# Patient Record
Sex: Female | Born: 1950 | Race: White | Hispanic: No
Health system: Midwestern US, Community
[De-identification: ages and names within clinical notes are randomized; demographics above are authoritative.]

---

## 2018-05-23 LAB — BASIC METABOLIC PANEL
Anion Gap: 12 mmol/L (ref 2–17)
BUN: 15 mg/dL (ref 8–23)
CO2: 26 mmol/L (ref 22–29)
Calcium: 8.8 mg/dL (ref 8.8–10.2)
Chloride: 98 mmol/L (ref 98–107)
Creatinine: 0.9 mg/dL (ref 0.5–0.9)
GFR African American: 77 mL/min/{1.73_m2} — ABNORMAL LOW (ref >=90)
GFR Non-African American: 67 mL/min/{1.73_m2} — ABNORMAL LOW (ref >=90)
Glucose: 115 mg/dL — ABNORMAL HIGH (ref 70–99)
OSMOLALITY CALCULATED: 274 mOsm/kg (ref 270–287)
Potassium: 3.7 mmol/L (ref 3.5–5.3)
Sodium: 136 mmol/L (ref 135–145)

## 2018-05-23 LAB — CBC
Hematocrit: 38.8 % (ref 38.0–47.0)
Hemoglobin: 13.1 g/dL (ref 11.5–15.7)
MCH: 31.3 pg (ref 27.0–34.5)
MCHC: 33.8 g/dL (ref 32.0–36.0)
MCV: 92.6 fL (ref 81.0–99.0)
MPV: 9.1 fL (ref 7.2–13.2)
Platelets: 280 10*3/uL (ref 140–440)
RBC: 4.19 x10e6/mcL (ref 3.60–5.20)
RDW: 13.4 % (ref 11.0–16.0)
WBC: 10.3 10*3/uL (ref 3.8–10.6)

## 2018-05-23 LAB — HEPATIC FUNCTION PANEL
ALT: 13 U/L (ref 0–33)
AST: 19 U/L (ref 0–32)
Albumin: 4 g/dL (ref 3.5–5.2)
Alk Phosphatase: 77 U/L (ref 35–117)
Bilirubin, Direct: <0.2 mg/dL (ref 0.00–0.30)
Total Bilirubin: <0.15 mg/dL (ref 0.00–1.20)
Total Protein: 6.6 g/dL (ref 6.4–8.3)

## 2018-05-23 LAB — LIPASE: Lipase: 63 U/L — ABNORMAL HIGH (ref 13–60)

## 2018-05-23 NOTE — Nursing Note (Signed)
Medication Administration Follow Up-Text       Medication Administration Follow Up Entered On:  05/23/2018 4:08 EDT    Performed On:  05/23/2018 4:08 EDT by Pernell Dupre, RN, Palmo T      Intervention Information:     ondansetron  Performed by Karolee Ohs, RN, NANCY A on 05/23/2018 03:48:00 EDT       ondansetron,4mg   IV Push,Antecubital, Left       Med Response   ED Medication Response :   No adverse reaction   Numeric Rating Pain Scale :   2   Pasero Opioid Induced Sedation Scale :   1 = Awake and alert   Adams, RN, Palmo T - 05/23/2018 4:08 EDT

## 2019-03-27 LAB — CBC WITH AUTO DIFFERENTIAL
Absolute Baso #: 0 10*3/uL (ref 0.0–0.2)
Absolute Eos #: 0.5 10*3/uL (ref 0.0–0.5)
Absolute Lymph #: 2.5 10*3/uL (ref 1.0–3.2)
Absolute Mono #: 0.7 10*3/uL (ref 0.3–1.0)
Basophils %: 0.3 % (ref 0.0–2.0)
Eosinophils %: 5.8 % (ref 0.0–7.0)
Hematocrit: 36.7 % (ref 34.0–47.0)
Hemoglobin: 12.2 g/dL (ref 11.5–15.7)
Immature Grans (Abs): 0.01 10*3/uL (ref 0.00–0.06)
Immature Granulocytes: 0.1 % (ref 0.1–0.6)
Lymphocytes: 28.9 % (ref 15.0–45.0)
MCH: 31.8 pg (ref 27.0–34.5)
MCHC: 33.2 g/dL (ref 32.0–36.0)
MCV: 95.6 fL (ref 81.0–99.0)
MPV: 9 fL (ref 7.2–13.2)
Monocytes: 7.7 % (ref 4.0–12.0)
Neutrophils %: 57.2 % (ref 42.0–74.0)
Neutrophils Absolute: 4.9 10*3/uL (ref 1.6–7.3)
Platelets: 286 10*3/uL (ref 140–440)
RBC: 3.84 x10e6/mcL (ref 3.60–5.20)
RDW: 13.1 % (ref 11.0–16.0)
WBC: 8.7 10*3/uL (ref 3.8–10.6)

## 2019-03-27 LAB — BASIC METABOLIC PANEL
Anion Gap: 11 mmol/L (ref 2–17)
BUN: 10 mg/dL (ref 8–23)
CO2: 27 mmol/L (ref 22–29)
Calcium: 9.2 mg/dL (ref 8.8–10.2)
Chloride: 101 mmol/L (ref 98–107)
Creatinine: 1 mg/dL — ABNORMAL HIGH (ref 0.5–0.9)
GFR African American: 68 mL/min/{1.73_m2} — ABNORMAL LOW (ref >=90)
GFR Non-African American: 58 mL/min/{1.73_m2} — ABNORMAL LOW (ref >=90)
Glucose: 120 mg/dL — ABNORMAL HIGH (ref 70–99)
OSMOLALITY CALCULATED: 276 mOsm/kg (ref 270–287)
Potassium: 3.3 mmol/L — ABNORMAL LOW (ref 3.5–5.3)
Sodium: 138 mmol/L (ref 135–145)

## 2019-03-27 LAB — ADD ON LAB TEST

## 2019-03-27 LAB — HEPATIC FUNCTION PANEL
ALT: 13 U/L (ref 0–33)
AST: 28 U/L (ref 0–32)
Albumin: 4.1 g/dL (ref 3.5–5.2)
Alk Phosphatase: 81 U/L (ref 35–117)
Bilirubin, Direct: <0.2 mg/dL (ref 0.00–0.30)
Total Bilirubin: 0.16 mg/dL (ref 0.00–1.20)
Total Protein: 6.8 g/dL (ref 6.4–8.3)

## 2019-03-27 LAB — CK: Total CK: 272 U/L — ABNORMAL HIGH (ref 20–180)

## 2019-03-27 LAB — TROPONIN T
Troponin T: <0.01 ng/mL (ref 0.000–0.010)
Troponin T: <0.01 ng/mL (ref 0.000–0.010)

## 2019-03-27 LAB — LIPASE: Lipase: 57 U/L (ref 13–60)

## 2019-03-27 LAB — N TERMINAL PROBNP (AKA NTPROBNP): NT Pro-BNP: 130 pg/mL — ABNORMAL HIGH (ref 0–125)

## 2019-03-27 NOTE — ED Notes (Signed)
ED Triage Note       ED Secondary Triage Entered On:  03/27/2019 4:36 EDT    Performed On:  03/27/2019 4:35 EDT by Andree Elk, RN, Palmo T               General Information   Barriers to Learning :   None evident   ED Home Meds Section :   Document assessment   [facility] ED Fall Risk Section :   Document assessment   ED History Section :   Document assessment   ED Advance Directives Section :   Document assessment   ED Palliative Screen :   Document assessment   Andree Elk, RN, Palmo T - 03/27/2019 4:35 EDT   (As Of: 03/27/2019 04:36:16 EDT)   Problems(Active)    Back pain (SNOMED CT  :606301601 )  Name of Problem:   Back pain ; Recorder:   GREGOR, RN, NANCY A; Confirmation:   Confirmed ; Classification:   Patient Stated ; Code:   [id number] ; Contributor System:   Conservation officer, nature ; Last Updated:   05/23/2018 3:27 EDT ; Life Cycle Date:   05/23/2018 ; Life Cycle Status:   Active ; Vocabulary:   SNOMED CT        CP (chronic pancreatitis) (SNOMED CT  :220254270 )  Name of Problem:   CP (chronic pancreatitis) ; Recorder:   Andree Elk, RN, Palmo T; Confirmation:   Confirmed ; Classification:   Patient Stated ; Code:   [id number] ; Contributor System:   Conservation officer, nature ; Last Updated:   05/23/2018 4:00 EDT ; Life Cycle Date:   05/23/2018 ; Life Cycle Status:   Active ; Vocabulary:   SNOMED CT        Hypercholesteremia (SNOMED CT  :51761607 )  Name of Problem:   Hypercholesteremia ; Recorder:   GREGOR, RN, NANCY A; Confirmation:   Confirmed ; Classification:   Patient Stated ; Code:   [id number] ; Contributor System:   Conservation officer, nature ; Last Updated:   05/23/2018 3:27 EDT ; Life Cycle Date:   05/23/2018 ; Life Cycle Status:   Active ; Vocabulary:   SNOMED CT        Neuropathy (SNOMED CT  :4854627035 )  Name of Problem:   Neuropathy ; Recorder:   GREGOR, RN, NANCY A; Confirmation:   Confirmed ; Classification:   Patient Stated ; Code:   [id number] ; Contributor System:   Conservation officer, nature ; Last Updated:   05/23/2018 3:28 EDT ; Life Cycle Date:   05/23/2018 ; Life  Cycle Status:   Active ; Vocabulary:   SNOMED CT        Sciatic pain (SNOMED CT  :37169678 )  Name of Problem:   Sciatic pain ; Recorder:   GREGOR, RN, NANCY A; Confirmation:   Confirmed ; Classification:   Patient Stated ; Code:   [id number] ; Contributor System:   PowerChart ; Last Updated:   05/23/2018 3:27 EDT ; Life Cycle Date:   05/23/2018 ; Life Cycle Status:   Active ; Vocabulary:   SNOMED CT          Diagnoses(Active)    Chest pain  Date:   03/27/2019 ; Diagnosis Type:   Reason For Visit ; Confirmation:   Complaint of ; Clinical Dx:   Chest pain ; Classification:   Medical ; Clinical Service:   Emergency medicine ; Code:   PNED ; Probability:   0 ; Diagnosis Code:   [id number]             -  Procedure History   (As Of: 03/27/2019 04:36:16 EDT)     Anesthesia Minutes:   0 ; Procedure Name:   Cesarean section ; Procedure Minutes:   0 ; Last Reviewed Dt/Tm:   05/23/2018 03:58:36 EDT            Anesthesia Minutes:   0 ; Procedure Name:   Cholecystectomy ; Procedure Minutes:   0 ; Last Reviewed Dt/Tm:   05/23/2018 03:59:19 EDT            Anesthesia Minutes:   0 ; Procedure Name:   Appendectomy ; Procedure Minutes:   0 ; Last Reviewed Dt/Tm:   05/23/2018 03:59:48 EDT            Anesthesia Minutes:   0 ; Procedure Name:   Hysterectomy ; Procedure Minutes:   0 ; Last Reviewed Dt/Tm:   05/23/2018 03:59:35 EDT            Anesthesia Minutes:   0 ; Procedure Name:   Gastric sleeve surgery ; Procedure Minutes:   0 ; Last Reviewed Dt/Tm:   05/23/2018 04:08:27 EDT            [facility] Fall Risk Assessment Tool   Hx of falling last 3 months ED Fall :   No   Patient confused or disoriented ED Fall :   No   Patient intoxicated or sedated ED Fall :   No   Patient impaired gait ED Fall :   No   Use a mobility assistance device ED Fall :   No   Patient altered elimination ED Fall :   No   [facility] ED Fall Score :   0    Adams, RN, Palmo T - 03/27/2019 4:35 EDT   ED Advance Directive   Advance Directive :    No   Adams, RN, Palmo T - 03/27/2019 4:35 EDT   Palliative Care   Does the Patient have a Life Limiting Illness :   None of the above   Pernell DupreAdams, RN, Palmo T - 03/27/2019 4:35 EDT   Social History   Social History   (As Of: 03/27/2019 04:36:16 EDT)   Tobacco:        Tobacco use: 10 or more cigarettes (1/2 pack or more)/day in last 30 days.  Cigarettes   (Last Updated: 05/23/2018 03:58:00 EDT by Pernell DupreAdams, RN, Palmo T)

## 2019-03-27 NOTE — ED Notes (Signed)
 ED Patient Summary       ;          [facility]  [address], [place], [place] [address]  [phone]  Discharge Instructions (Patient)  _______________________________________     Name:[name], [name]  DOB:  1950-08-21                   MRN: [id number]                   FIN: [id number]  Reason For Visit: Chest pain; CHEST PAIN  Final Diagnosis: Gastritis     Visit Date: 03/27/2019 04:22:00  Address: [address] [place] [address]  Phone: [phone]     Primary Care Provider:      Name: STEPHEN CAMIE JACOBUS      Phone: [phone]        Emergency Department Providers:         Primary Physician:   LANE JINNY PRENTICE [facility] would like to thank you for allowing us  to assist you with your healthcare needs. The following includes patient education materials and information regarding your injury/illness.     Follow-up Instructions:  You were treated today on an emergency basis. If instructed, please contact your primary care provider to arrange for follow-up and for any further concerns. Whether you have been referred to your primary care doctor or a specialist, please follow-up as instructed.      If you do not have a doctor, you may call [phone] for assistance with finding a [facility] primary care physician or specialist. Staff is available to help schedule you an appointment.      Not sure where to go with questions about your health? We're here for you. The [facility] "Ask a Nurse" Line in staffed by experienced nurses and is a free service to the community, available Monday - Friday from 8AM to 5PM. Call [phone].      If your condition worsens before your follow-up with an outpatient physician, please return to the Emergency Department.              With: Address: When:   [facility] [address] [place], [place] [address]  [phone] Business (1) Within 1 week       With: Address:  When:   [facility] [address] [place] [address]  [phone] Business (1) Within 1 to 2 days   Comments:   Please take your medications as prescribed.  Please return to the emergency department with any worsening of your symptoms, difficulty tolerating oral intake, or any other new or concerning symptoms.       With: Address: When:   [facility], [address], [address] [place], [place] [address]  [phone] Business (1) Within 2 to 4 days   Comments:   Return to ED if symptoms worsen              Printed Prescriptions:    Patient Education Materials:  Discharge Orders          Discharge Patient 03/27/19 7:06:00 EDT         Comment:      Gastritis, Adult     Gastritis, Adult    Gastritis is soreness and swelling (inflammation) of the lining of the stomach. Gastritis can develop as  a sudden onset (acute) or long-term (chronic) condition. If gastritis is not treated, it can lead to stomach bleeding and ulcers.      CAUSES    Gastritis occurs when the stomach lining is weak or damaged. Digestive juices from the stomach then inflame the weakened stomach lining. The stomach lining may be weak or damaged due to viral or bacterial infections. One common bacterial infection is the Helicobacter pylori  infection. Gastritis can also result from excessive alcohol consumption, taking certain medicines, or having too much acid in the stomach.     SYMPTOMS    In some cases, there are no symptoms. When symptoms are present, they may include:     Pain or a burning sensation in the upper abdomen.     Nausea.     Vomiting.     An uncomfortable feeling of fullness after eating.     DIAGNOSIS    Your caregiver may suspect you have gastritis based on your symptoms and a physical exam. To determine the cause of your gastritis, your caregiver may perform the following:     Blood or stool tests to check for the H pylori bacterium.     Gastroscopy. A thin,  flexible tube (endoscope) is passed down the esophagus and into the stomach. The endoscope has a light and camera on the end. Your caregiver uses the endoscope to view the inside of the stomach.     Taking a tissue sample (biopsy) from the stomach to examine under a microscope.     TREATMENT    Depending on the cause of your gastritis, medicines may be prescribed. If you have a bacterial infection, such as an H pylori infection, antibiotics may be given. If your gastritis is caused by too much acid in the stomach, H2 blockers or antacids may be given. Your caregiver may recommend that you stop taking aspirin, ibuprofen, or other nonsteroidal anti-inflammatory drugs (NSAIDs).    HOME CARE INSTRUCTIONS     Only take over-the-counter or prescription medicines as directed by your caregiver.     If you were given antibiotic medicines, take them as directed. Finish them even if you start to feel better.     Drink enough fluids to keep your urine clear or pale yellow.      Avoid foods and drinks that make your symptoms worse, such as:    ? Caffeine or alcoholic drinks.    ? Chocolate.    ? Peppermint or mint flavorings.    ? Garlic and onions.    ? Spicy foods.    ? Citrus fruits, such as oranges, lemons, or limes.    ? Tomato-based foods such as sauce, chili, salsa, and pizza.    ? Fried and fatty foods.     Eat small, frequent meals instead of large meals.    SEEK IMMEDIATE MEDICAL CARE IF:     You have black or dark red stools.     You vomit blood or material that looks like coffee grounds.     You are unable to keep fluids down.     Your abdominal pain gets worse.     You have a fever.     You do not feel better after 1 week.      You have any other questions or concerns.    MAKE SURE YOU:     Understand these instructions.     Will watch your condition.     Will get help right away if  you are not doing well or get worse.    This information is not intended to replace advice given to you by your health care provider.  Make sure you discuss any questions you have with your health care provider.    Document Released: 07/14/2001 Document Revised: 01/19/2012 Document Reviewed: 09/02/2011  Elsevier Interactive Patient Education ?2016 [employer].         Allergy Info: sulfa drugs; Demerol     Medication Information:  [facility] ED Physicians provided you with a complete list of medications post discharge, if you have been instructed to stop taking a medication please ensure you also follow up with this information to your Primary Care Physician.  Unless otherwise noted, patient will continue to take medications as prescribed prior to the Emergency Room visit.  Any specific questions regarding your chronic medications and dosages should be discussed with your physician(s) and pharmacist.          ezetimibe (ezetimibe 10 mg oral tablet)  famotidine (Pepcid 20 mg oral tablet) 1 Tabs Oral (given by mouth) 2 times a day for 7 Days. Refills: 0.  FLUoxetine (FLUoxetine 20 mg oral tablet) 1 Tabs Oral (given by mouth) every day., DO NOT CRUSH     SOUND ALIKE / LOOK ALIKE - VERIFY DRUG  fluticasone-salmeterol (Advair Diskus 100 mcg-50 mcg inhalation powder) 2 times a day.  HYDROcodone-acetaminophen (Norco 325 mg-7.5 mg oral tablet) 1 Tabs Oral (given by mouth) 3 times a day.  hydrOXYzine (hydrOXYzine hydrochloride 25 mg oral tablet)  multivitamin (Vitamin B Complex oral capsule) 1 Capsules Oral (given by mouth) every day.  multivitamin, prenatal (Prenatal Multivitamins oral tablet)  oxyCODONE (Xtampza ER 13.5 mg oral capsule, extended release) 1 Capsules Oral (given by mouth) every 12 hours.  pregabalin (pregabalin 200 mg oral capsule)  rosuvastatin (rosuvastatin 40 mg oral tablet) 1 Tabs Oral (given by mouth) every day.  tiZANidine 4 Milligram Oral (given by mouth) every 8 hours.      Medications Administered During Visit:              Medication Dose Route   Sodium Chloride 0.9% 1000 mL IV   lidocaine topical 15  mL Oral   Al hydroxide/Mg hydroxide/simethicone 30 mL Oral   famotidine 20 mg IV Push          Major Tests and Procedures:  The following procedures and tests were performed during your ED visit.  PROCEDURES%>  PROCEDURES COMMENTS%>          Laboratory Orders  Name Status Details   Add On Completed Blood, Stat, ST - Stat, Collected, 03/27/19 6:01:00 EDT, Once, 03/27/19 6:01:00 EDT, HURT-MD,  DAVID C, Print label Y/N, lipase,, Draw Stat   Add On Completed Blood, Stat, ST - Stat, Collected, 03/27/19 6:02:00 EDT, Once, 03/27/19 6:02:00 EDT, HURT-MD,  DAVID C, Print label Y/N, hepatic panel, Draw Stat   BMP Completed Blood, Stat, ST - Stat, 03/27/19 4:35:00 EDT, 03/27/19 4:36:00 EDT, Nurse collect, LANE JINNY BARTER, Print label Y/N   CBCDIFF Completed Blood, Stat, ST - Stat, 03/27/19 4:35:00 EDT, 03/27/19 4:36:00 EDT, Nurse collect, LANE JINNY BARTER, Print label Y/N   CPK Completed Blood, Stat, ST - Stat, 03/27/19 4:35:00 EDT, 03/27/19 4:36:00 EDT, Nurse collect, LANE JINNY BARTER, Print label Y/N   Hepatic Completed Blood, Stat, ST - Stat, Collected, 03/27/19 4:36:00 EDT [id number], 03/27/19 4:36:00 EDT, Nurse collect, Venous Draw, 03/27/19 6:04:00 EDT, BH CP Login, POTTER,  JINNY BARTER, Print  label Y/N, [email or web address], 4 mL SST PST/*A*/   Lipase Lvl Completed Blood, ST, ST - Stat, Collected, 03/27/19 4:36:00 EDT Myra, RN, Palmo T, 03/27/19 4:36:00 EDT, Venous Draw, 03/27/19 4:40:00 EDT, BH CP Login, [id number], Print label Y/N, [email or web address], 4 mL SST PST/*B*/   NTproBNP Completed Blood, Stat, ST - Stat, 03/27/19 4:35:00 EDT, 03/27/19 4:36:00 EDT, Nurse collect, LANE JINNY BARTER, Print label Y/N   Trop T Completed Blood, Routine, RT - Routine, 03/27/19 6:35:00 EDT, 03/27/19 6:35:00 EDT, Nurse collect, LANE JINNY BARTER, Print label Y/N   Trop T Completed Blood, Stat, ST - Stat, 03/27/19 4:35:00 EDT, 03/27/19 4:36:00 EDT, Nurse collect, LANE JINNY BARTER, Print label Y/N   Irwin Carbon Completed Blood, ST, ST - Stat,  Collected, 03/27/19 4:36:00 EDT [id number], 03/27/19 4:36:00 EDT, Venous Draw, 03/27/19 4:39:00 EDT, BH CP Login, [id number], Print label Y/N, [email or web address], Complete   XTube Grn Completed Blood, ST, ST - Stat, Collected, 03/27/19 4:36:00 EDT [id number], 03/27/19 4:36:00 EDT, Venous Draw, 03/27/19 4:40:00 EDT, BH CP Login, [id number], Print label Y/N, [email or web address], Complete   XTube PST Completed Blood, ST, ST - Stat, Collected, 03/27/19 4:36:00 EDT [id number], 03/27/19 4:36:00 EDT, Venous Draw, 03/27/19 4:40:00 EDT, BH CP Login, [id number], Print label Y/N, [email or web address], Complete               Radiology Orders  Name Status Details   XR Chest 1 View Portable Ordered 03/27/19 4:35:00 EDT, STAT 1 hour or less, Reason: Chest pain, Transport Mode: PORTABLE, [email or web address]               Patient Care Orders  Name Status Details   Blood Pressure Completed 03/27/19 4:35:00 EDT, Once, 03/27/19 4:35:00 EDT, Check BP both arms   Cardiac/NIBP/Pulse Ox Monitoring Completed 03/27/19 4:35:00 EDT, This message can only be seen by Nursing, it is not visible to Pharmacy, Laboratory, or Radiology., 03/27/19 4:35:00 EDT, 03/27/19 4:35:00 EDT, Once   Discharge Patient Ordered 03/27/19 7:06:00 EDT   ED Assessment Adult Completed 03/27/19 4:35:27 EDT, 03/27/19 4:35:27 EDT   ED Only Oxygen Therapy Completed 03/27/19 4:35:00 EDT, STAT 1 hour or less, 03/27/19 4:35:00 EDT, Keep SAT > 92%   ED Secondary Triage Completed 03/27/19 4:35:27 EDT, 03/27/19 4:35:27 EDT   ED Triage Adult Completed 03/27/19 4:22:28 EDT, 03/27/19 4:22:28 EDT   Saline Lock Insert Completed 03/27/19 4:35:00 EDT, Once, 03/27/19 4:35:00 EDT       ---------------------------------------------------------------------------------------------------------------------  [facility]) encourages you to self-enroll in the [facility] Patient Portal.  [facility] Patient Portal will allow you to manage your personal health information securely from  your own electronic device now and in the future.  To begin your Patient Portal enrollment process, please visit [email or web address] Click on "Sign up now" under [facility].  If you find that you need additional assistance on the [facility] Patient Portal or need a copy of your medical records, please call the [facility] Medical Records Office at [phone].  Comment:

## 2019-03-27 NOTE — Discharge Summary (Signed)
ED Clinical Summary                         [facility]  [address]  [place], [place] [id number]  [phone]           PERSON INFORMATION  Name: [name] [name], [name] [name] Age:  68 Years DOB: 02/09/1951   Sex: Female Language: English PCP: Kandace BlitzMONTOYA-MD,  SARA BLAKELY   Marital Status:  Married Phone: [phone] Med Service: MED-Medicine   MRN:  [id number] Acct# [id number] Arrival: 03/27/2019 04:22:00   Visit Reason: Chest pain; CHEST PAIN Acuity: 3 LOS: 000 02:56   Address:      [address] [place] [id number]  Diagnosis:      Gastritis  Printed Prescriptions:            Allergies      sulfa drugs (unknwon) (burn and itch)      Demerol (makes me stop breathing)      Medications Administered During Visit:                  Medication Dose Route   Sodium Chloride 0.9% 1000 mL IV   lidocaine topical 15 mL Oral   Al hydroxide/Mg hydroxide/simethicone 30 mL Oral   famotidine 20 mg IV Push       Patient Medication List:              ezetimibe (ezetimibe 10 mg oral tablet)  famotidine (Pepcid 20 mg oral tablet) 1 Tabs Oral (given by mouth) 2 times a day for 7 Days. Refills: 0.  FLUoxetine (FLUoxetine 20 mg oral tablet) 1 Tabs Oral (given by mouth) every day., DO NOT CRUSH     SOUND ALIKE / LOOK ALIKE - VERIFY DRUG  fluticasone-salmeterol (Advair Diskus 100 mcg-50 mcg inhalation powder) 2 times a day.  HYDROcodone-acetaminophen (Norco 325 mg-7.5 mg oral tablet) 1 Tabs Oral (given by mouth) 3 times a day.  hydrOXYzine (hydrOXYzine hydrochloride 25 mg oral tablet)  multivitamin (Vitamin B Complex oral capsule) 1 Capsules Oral (given by mouth) every day.  multivitamin, prenatal (Prenatal Multivitamins oral tablet)  oxyCODONE (Xtampza ER 13.5 mg oral capsule, extended release) 1 Capsules Oral (given by mouth) every 12 hours.  pregabalin (pregabalin 200 mg oral capsule)  rosuvastatin (rosuvastatin 40 mg oral tablet) 1 Tabs Oral (given by mouth) every day.  tiZANidine 4 Milligram Oral (given by  mouth) every 8 hours.         Major Tests and Procedures:  The following procedures and tests were performed during your ED visit.  COMMONPROCEDURES%>  COMMON PROCEDURESCOMMENTS%>          Laboratory Orders  Name Status Details   Add On Completed Blood, Stat, ST - Stat, Collected, 03/27/19 6:01:00 EDT, Once, 03/27/19 6:01:00 EDT, HURT-MD,  DAVID C, Print label Y/N, lipase,, Draw Stat   Add On Completed Blood, Stat, ST - Stat, Collected, 03/27/19 6:02:00 EDT, Once, 03/27/19 6:02:00 EDT, HURT-MD,  DAVID C, Print label Y/N, hepatic panel, Draw Stat   BMP Completed Blood, Stat, ST - Stat, 03/27/19 4:35:00 EDT, 03/27/19 4:36:00 EDT, Nurse collect, [name] RuddlePOTTER,  J ANDREW, Print label Y/N   CBCDIFF Completed Blood, Stat, ST - Stat, 03/27/19 4:35:00 EDT, 03/27/19 4:36:00 EDT, Nurse collect, [name] RuddlePOTTER,  J ANDREW, Print label Y/N   CPK Completed Blood, Stat, ST - Stat, 03/27/19 4:35:00 EDT, 03/27/19 4:36:00 EDT, Nurse collect, [name] RuddlePOTTER,  J ANDREW, Print label Y/N   Hepatic  Completed Blood, Stat, ST - Stat, Collected, 03/27/19 4:36:00 EDT [id number], 03/27/19 4:36:00 EDT, Nurse collect, Venous Draw, 03/27/19 6:04:00 EDT, BH CP Login, POTTER,  J ANDREW, Print label Y/N, bh1_spec_lbl1, 4 mL SST PST/*A*/   Lipase Lvl Completed Blood, ST, ST - Stat, Collected, 03/27/19 4:36:00 EDT Pernell DupreAdams, RN, Palmo T, 03/27/19 4:36:00 EDT, Venous Draw, 03/27/19 4:40:00 EDT, BH CP Login, [id number], Print label Y/N, bh1_spec_lbl1, 4 mL SST PST/*B*/   NTproBNP Completed Blood, Stat, ST - Stat, 03/27/19 4:35:00 EDT, 03/27/19 4:36:00 EDT, Nurse collect, [name] RuddlePOTTER,  J ANDREW, Print label Y/N   Trop T Completed Blood, Routine, RT - Routine, 03/27/19 6:35:00 EDT, 03/27/19 6:35:00 EDT, Nurse collect, [name] RuddlePOTTER,  J ANDREW, Print label Y/N   Trop T Completed Blood, Stat, ST - Stat, 03/27/19 4:35:00 EDT, 03/27/19 4:36:00 EDT, Nurse collect, [name] RuddlePOTTER,  J ANDREW, Print label Y/N   Joselyn GlassmanXTube Blue Completed Blood, ST, ST - Stat, Collected, 03/27/19 4:36:00 EDT [id number], 03/27/19 4:36:00 EDT,  Venous Draw, 03/27/19 4:39:00 EDT, BH CP Login, [id number], Print label Y/N, [email or web address]_spec_lbl1, Complete   XTube Grn Completed Blood, ST, ST - Stat, Collected, 03/27/19 4:36:00 EDT [id number], 03/27/19 4:36:00 EDT, Venous Draw, 03/27/19 4:40:00 EDT, BH CP Login, [id number], Print label Y/N, [email or web address]_[email or web address], Complete   XTube PST Completed Blood, ST, ST - Stat, Collected, 03/27/19 4:36:00 EDT [id number], 03/27/19 4:36:00 EDT, Venous Draw, 03/27/19 4:40:00 EDT, BH CP Login, [id number], Print label Y/N, [email or web address]_spec_lbl1, Complete               Radiology Orders  Name Status Details   XR Chest 1 View Portable Ordered 03/27/19 4:35:00 EDT, STAT 1 hour or less, Reason: Chest pain, Transport Mode: PORTABLE, pp_set_radiology_subspecialty               Patient Care Orders  Name Status Details   Blood Pressure Completed 03/27/19 4:35:00 EDT, Once, 03/27/19 4:35:00 EDT, Check BP both arms   Cardiac/NIBP/Pulse Ox Monitoring Completed 03/27/19 4:35:00 EDT, This message can only be seen by Nursing, it is not visible to Pharmacy, Laboratory, or Radiology., 03/27/19 4:35:00 EDT, 03/27/19 4:35:00 EDT, Once   Discharge Patient Ordered 03/27/19 7:06:00 EDT   ED Assessment Adult Completed 03/27/19 4:35:27 EDT, 03/27/19 4:35:27 EDT   ED Only Oxygen Therapy Completed 03/27/19 4:35:00 EDT, STAT 1 hour or less, 03/27/19 4:35:00 EDT, Keep SAT > 92%   ED Secondary Triage Completed 03/27/19 4:35:27 EDT, 03/27/19 4:35:27 EDT   ED Triage Adult Completed 03/27/19 4:22:28 EDT, 03/27/19 4:22:28 EDT   Saline Lock Insert Completed 03/27/19 4:35:00 EDT, Once, 03/27/19 4:35:00 EDT             PROVIDER INFORMATION               Provider Role Assigned Lavonne ChickUnassigned   POTTER, J ANDREW ED Provider 03/27/2019 04:22:55    Pernell DupreAdams, RN, Palmo T ED Nurse 03/27/2019 04:53:56    Karolee OhsGREGOR, RNErma Heritage, NANCY A ED Nurse 03/27/2019 [phone]:33:22        Attending Physician:  [name] RuddlePOTTER,  J ANDREW     Admit Doc  POTTER,  Osborne OmanJ ANDREW     Consulting Doc       VITALS INFORMATION  Vital Sign Triage Latest   Temp Oral  ORAL_1%>37 degC ORAL%>37 degC   Temp Temporal TEMPORAL_1%> TEMPORAL%>   Temp Intravascular INTRAVASCULAR_1%> INTRAVASCULAR%>   Temp Axillary AXILLARY_1%> AXILLARY%>   Temp Rectal RECTAL_1%> RECTAL%>   02 Sat 97 % 99 %   Respiratory Rate RATE_1%>18 br/min RATE%>22 br/min   Peripheral Pulse Rate PULSE RATE_1%>78  bpm PULSE RATE%>78 bpm   Apical Heart Rate HEART RATE_1%> HEART RATE%>   Blood Pressure BLOOD PRESSURE_1%>/ BLOOD PRESSURE_1%>89 mmHg BLOOD PRESSURE%>119 mmHg / BLOOD PRESSURE%>66 mmHg                 Immunizations      No Immunizations Documented This Visit          DISCHARGE INFORMATION   Discharge Disposition: H Outpt-Sent Home   Discharge Location:    Home   Discharge Date and Time:    03/27/2019 07:18:01   ED Checkout Date and Time:    03/27/2019 07:18:01     DEPART REASON INCOMPLETE INFORMATION               Depart Action Incomplete Reason   Interactive View/I&O Recently assessed               Problems      No Problems Documented              Smoking Status      10 or more cigarettes (1/2 pack or more)/day in last 30 days         PATIENT EDUCATION INFORMATION  Instructions:       Gastritis, Adult     Follow up:                    With: Address: When:   [facility] [address] [place] [place] [address]  [phone] Business (1) Within 1 week       With: Address: When:   SARA MONTOYA-MD [address] [place] [address]  [phone] Business (1) Within 1 to 2 days   Comments:   Please take your medications as prescribed.  Please return to the emergency department with any worsening of your symptoms, difficulty tolerating oral intake, or any other new or concerning symptoms.       With: Address: When:   [facility], [address], [address] [place], [place] [address]  [phone] Business (1) Within 2 to 4 days   Comments:   Return to ED if symptoms worsen           ED PROVIDER DOCUMENTATION     Patient:   [name] [name], [name] [name]              MRN: [id number]            FIN: [id number]               Age:   68 years     Sex:  Female     DOB:  June 02, 1951   Associated Diagnoses:   Gastritis   Author:   [place] [name]      Basic Information   Time seen: Provider Seen (ST)   ED Provider/Time:    [name] [name] / 03/27/2019 04:22  .   Additional information: Chief Complaint from Nursing Triage Note   Chief Complaint   No qualifying data available.Marland Kitchen      History of Present Illness   68 year old female with a history of hypertension COPD, Esophagitis who had an episode of epigastric burning pain yesterday evening she was going to bed because her vomit once this was nonbloody nonbilious-like vomit.  She then went to bed and had a epigastric burning sensation again that woke her from sleep.  She noted improvement with elevating and getting up to walk around she had no associated  shortness of breath or dyspnea on exertion or fever chills or body aches.  She had a former history of reflux has been treated with Carafate but she was unable to tolerate this she has not been on any recent antacids and she had a large greasy fatty meal yesterday before going to bed and feels that this provoked her overall symptoms.  Otherwise she endorses no dark stool or other concern she has no other concerning risk factors from an ACS standpoint.  Took some Tums prior to arrival and felt improvement with these meds.      Review of Systems   Constitutional symptoms:  No fever, no chills, no sweats, no generalized weakness, no fatigue, no decreased activity.    Skin symptoms:  No jaundice, no rash.    Eye symptoms:  Negative except as documented in HPI.   ENMT symptoms:  Negative except as documented in HPI.   Respiratory symptoms:  Negative except as documented in HPI.   Cardiovascular symptoms:  No chest pain, no palpitations, no tachycardia, no syncope, no diaphoresis.    Gastrointestinal symptoms:  Abdominal pain, epigastric, burning, cramping, no nausea, no vomiting, no diarrhea.     Genitourinary symptoms:  Negative except as documented in HPI.   Musculoskeletal symptoms:  No back pain, no Muscle pain, no Joint pain.    Neurologic symptoms:  No headache, no dizziness, no altered level of consciousness, no numbness, no tingling, no focal weakness.    Psychiatric symptoms:  No anxiety,              Additional review of systems information: All other systems reviewed and otherwise negative.      Health Status   Allergies:    Allergic Reactions (Selected)  Unknown  Demerol- Makes me stop breathing.  Sulfa drugs- Unknwon and burn and itch..      Past Medical/ Family/ Social History   Medical history: Reviewed as documented in chart.   Surgical history: Reviewed as documented in chart.   Family history: Not significant.   Social history: Reviewed as documented in chart.   Problem list:    Active Problems (5)  Back pain   CP (chronic pancreatitis)   Hypercholesteremia   Neuropathy   Sciatic pain   .      Physical Examination               Vital Signs   Reviewed Results: Vital Signs(Date Range: 03/26/2019 0:00 EDT - 03/27/2019 5:48 EDT).   Reviewed Results: Basic Oxygen Information(Date Range: 03/26/2019 0:00 EDT - 03/27/2019 5:48 EDT).   General:  Alert, no acute distress.    Glasgow coma scale:  Total score: Total score: 15.   Neurological:  Alert and oriented to person, place, time, and situation, No focal neurological deficit observed.    Skin:  Warm, pink.    Head:  Normocephalic.   Neck:  Supple, trachea midline.    Eye:  Pupils are equal, round and reactive to light, normal conjunctiva.    Ears, nose, mouth and throat:  Oral mucosa moist.   Cardiovascular:  Regular rate and rhythm.   Respiratory:  Lungs are clear to auscultation, respirations are non-labored.    Chest wall:  No tenderness.   Back:  Nontender, Normal range of motion.    Musculoskeletal:  Normal ROM, normal strength.    Gastrointestinal:  Soft, Nontender.    Genitourinary   Psychiatric:  Cooperative.      Medical Decision Making    Electrocardiogram:  Emergency Provider interpretation performed by me, No ST-T changes, no ectopy, normal PR & QRS intervals, Reviewed Results: (Date Range: 03/26/2019 0:00 EDT - 03/27/2019 5:49 EDT).       Reexamination/ Reevaluation   Course: improving.   Pain status: decreased.   Assessment: Patient's overall symptoms dramatically improving find GI cocktail and Pepcid.  I feel as though these preceding symptoms are related to a reflux after recently eating greasy fatty meal.  She has no other concerning ACS symptoms her heart score is low her symptoms are nonexertional she had a former cardiac work-up which is been unremarkable.  I will recommend diet modifications and close follow-up with GI will place her on Pepcid until further evaluation.  If she were to have any further concerning symptoms encouraged her to return immediately.  We will repeat her troponin and EKG and if negative will be appropriate charge home for follow-up as advised..      Impression and Plan   Diagnosis   Gastritis (ICD10-CM K29.70, Discharge, Medical)   Plan   Condition: Improved, Stable.    Disposition: Discharged: to home.    Prescriptions: Launch prescriptions   Pharmacy:  Pepcid 20 mg oral tablet (Prescribe): 20 mg, 1 tabs, Oral, BID, for 7 days, 14 tabs, 0 Refill(s).    Patient was given the following educational materials: Gastritis, Adult.    Follow up with: [place] Within 2 to 4 days Return to ED if symptoms worsen.    Counseled: Patient, I had a detailed discussion with the patient and/or guardian regarding the historical points/exam findings supporting the discharge diagnosis and need for outpatient followup. Discussed the need to return to the ER if symptoms persist/worsen, or for any questions/concerns that arise at home.

## 2019-03-27 NOTE — ED Notes (Signed)
 ED Triage Note       ED Triage Adult Entered On:  03/27/2019 4:35 EDT    Performed On:  03/27/2019 4:26 EDT by Myra, RN, Palmo T               Triage   Chief Complaint :    states i woke up 20 mins ago with horrible chest pain pt also states i threw up before i went to bed   Numeric Rating Pain Scale :   7   [place] Mode of Arrival :   Private vehicle   Infectious Disease Documentation :   Document assessment   Patient received chemo or biotherapy last 48 hrs? :   No   Temperature Oral :   37 degC(Converted to: 98.6 degF)    Heart Rate Monitored :   82 bpm   Respiratory Rate :   18 br/min   Systolic Blood Pressure :   143 mmHg (HI)    Diastolic Blood Pressure :   89 mmHg   SpO2 :   97 %   Oxygen Therapy :   Room air   Patient presentation :   None of the above   Chief Complaint or Presentation suggest infection :   No   Dosing Weight Obtained By :   Measured   Weight Dosing :   99.6 kg(Converted to: 219 lb 9 oz)    Height :   164 cm(Converted to: 5 ft 5 in)    Body Mass Index Dosing :   37 kg/m2   Myra, RN, Palmo T - 03/27/2019 4:26 EDT   DCP GENERIC CODE   Tracking Acuity :   3   Tracking Group :   ED [facility] [employer], RN, [place] T - 03/27/2019 4:26 EDT   ED General Section :   Document assessment   Pregnancy Status :   N/A   ED Allergies Section :   Document assessment   ED Reason for Visit Section :   Document assessment   ED Home Meds Section :   Document assessment   Adams, RN, Palmo T - 03/27/2019 4:26 EDT   ID Risk Screen Symptoms   Recent Travel History :   No recent travel   Close Contact with COVID-19 ID :   No   Last 14 days COVID-19 ID :   No   TB Symptom Screen :   No symptoms   C. diff Symptom/History ID :   Neither of the above   Adams, RN, Palmo T - 03/27/2019 4:26 EDT   Allergies   (As Of: 03/27/2019 04:35:26 EDT)   Allergies (Active)   Demerol  Estimated Onset Date:   Unspecified ; Reactions:   makes me stop breathing ; Created By:   FARRELL, RN, NANCY A; Reaction Status:   Active ;  Category:   Drug ; Substance:   Demerol ; Type:   Allergy ; Severity:   Unknown ; Updated By:   FARRELL RN, INOCENTE LABOR; Reviewed Date:   03/27/2019 4:25 EDT      sulfa drugs  Estimated Onset Date:   Unspecified ; Reactions:   unknwon, burn and itch ; Created By:   FARRELL, RN, NANCY A; Reaction Status:   Active ; Category:   Drug ; Substance:   sulfa drugs ; Type:   Allergy ; Severity:   Unknown ; Updated By:   FARRELL OBIE INOCENTE LABOR; Reviewed Date:   03/27/2019 4:25 EDT  Psycho-Social   Last 3 mo, thoughts killing self/others :   Patient denies   Feels Unsafe at Home :   No   Adams, RN, Palmo T - 03/27/2019 4:26 EDT   ED Home Med List   Medication List   (As Of: 03/27/2019 04:35:26 EDT)   Prescription/Discharge Order    omeprazole  :   omeprazole ; Status:   Completed ; Ordered As Mnemonic:   PriLOSEC 20 mg oral delayed release capsule ; Simple Display Line:   20 mg, 1 caps, Oral, Daily, 30 caps, 0 Refill(s) ; Ordering Provider:   GENNY, MD, MAUDE MANGO; Catalog Code:   omeprazole ; Order Dt/Tm:   05/23/2018 04:47:42 EDT          sucralfate  :   sucralfate ; Status:   Completed ; Ordered As Mnemonic:   Carafate 1 g oral tablet ; Simple Display Line:   1 g, 1 tabs, Oral, TID, for 7 days, PRN: indigestion, 21 tabs, 0 Refill(s) ; Ordering Provider:   GENNY, MD, MAUDE MANGO; Catalog Code:   sucralfate ; Order Dt/Tm:   05/23/2018 04:47:54 EDT            Home Meds    fluticasone-salmeterol  :   fluticasone-salmeterol ; Status:   Documented ; Ordered As Mnemonic:   Advair Diskus 100 mcg-50 mcg inhalation powder ; Simple Display Line:   inh, BID, 0 Refill(s) ; Catalog Code:   fluticasone-salmeterol ; Order Dt/Tm:   03/27/2019 04:35:01 EDT          hydrOXYzine  :   hydrOXYzine ; Status:   Documented ; Ordered As Mnemonic:   hydrOXYzine hydrochloride 25 mg oral tablet ; Simple Display Line:   0 Refill(s) ; Catalog Code:   hydrOXYzine ; Order Dt/Tm:   03/27/2019 04:35:07 EDT          ezetimibe  :   ezetimibe ; Status:    Documented ; Ordered As Mnemonic:   ezetimibe 10 mg oral tablet ; Simple Display Line:   0 Refill(s) ; Catalog Code:   ezetimibe ; Order Dt/Tm:   03/27/2019 04:35:07 EDT          pregabalin  :   pregabalin ; Status:   Documented ; Ordered As Mnemonic:   pregabalin 200 mg oral capsule ; Simple Display Line:   0 Refill(s) ; Catalog Code:   pregabalin ; Order Dt/Tm:   03/27/2019 04:35:07 EDT          FLUoxetine  :   FLUoxetine ; Status:   Documented ; Ordered As Mnemonic:   FLUoxetine 20 mg oral tablet ; Simple Display Line:   20 mg, 1 tabs, Oral, Daily, 30 tabs, 0 Refill(s) ; Catalog Code:   FLUoxetine ; Order Dt/Tm:   05/23/2018 04:02:26 EDT ; Comment:   DO NOT CRUSH     SOUND ALIKE / LOOK ALIKE - VERIFY DRUG          tiZANidine  :   tiZANidine ; Status:   Documented ; Ordered As Mnemonic:   tiZANidine ; Simple Display Line:   4 mg, Oral, q8hr, 0 Refill(s) ; Catalog Code:   tiZANidine ; Order Dt/Tm:   05/23/2018 04:02:16 EDT          HYDROcodone-acetaminophen  :   HYDROcodone-acetaminophen ; Status:   Documented ; Ordered As Mnemonic:   Norco 325 mg-7.5 mg oral tablet ; Simple Display Line:   1 tabs, Oral, TID, 0 Refill(s) ; Catalog Code:  HYDROcodone-acetaminophen ; Order Dt/Tm:   05/23/2018 03:32:32 EDT          multivitamin  :   multivitamin ; Status:   Documented ; Ordered As Mnemonic:   Vitamin B Complex oral capsule ; Simple Display Line:   1 caps, Oral, Daily, 30 caps, 0 Refill(s) ; Catalog Code:   multivitamin ; Order Dt/Tm:   05/23/2018 03:31:05 EDT          multivitamin, prenatal  :   multivitamin, prenatal ; Status:   Documented ; Ordered As Mnemonic:   Prenatal Multivitamins oral tablet ; Simple Display Line:   0 Refill(s) ; Catalog Code:   multivitamin, prenatal ; Order Dt/Tm:   05/23/2018 03:31:25 EDT          oxyCODONE  :   oxyCODONE ; Status:   Documented ; Ordered As Mnemonic:   Xtampza ER 13.5 mg oral capsule, extended release ; Simple Display Line:   13.5 mg, 1 caps, Oral, q12hr, 0 Refill(s) ;  Catalog Code:   oxyCODONE ; Order Dt/Tm:   05/23/2018 03:30:08 EDT          rosuvastatin  :   rosuvastatin ; Status:   Documented ; Ordered As Mnemonic:   rosuvastatin 40 mg oral tablet ; Simple Display Line:   40 mg, 1 tabs, Oral, Daily, 0 Refill(s) ; Catalog Code:   rosuvastatin ; Order Dt/Tm:   05/23/2018 03:30:29 EDT          gabapentin  :   gabapentin ; Status:   Completed ; Ordered As Mnemonic:   gabapentin 600 mg oral tablet ; Simple Display Line:   1,200 mg, 2 tabs, Oral, TID, 270 tabs, 0 Refill(s) ; Catalog Code:   gabapentin ; Order Dt/Tm:   05/23/2018 03:29:36 EDT            ED Reason for Visit   (As Of: 03/27/2019 04:35:26 EDT)   Problems(Active)    Back pain (SNOMED CT  :747688984 )  Name of Problem:   Back pain ; Recorder:   GREGOR, RN, NANCY A; Confirmation:   Confirmed ; Classification:   Patient Stated ; Code:   [id number] ; Contributor System:   PowerChart ; Last Updated:   05/23/2018 3:27 EDT ; Life Cycle Date:   05/23/2018 ; Life Cycle Status:   Active ; Vocabulary:   SNOMED CT        CP (chronic pancreatitis) (SNOMED CT  :647014989 )  Name of Problem:   CP (chronic pancreatitis) ; Recorder:   Myra, RN, Palmo T; Confirmation:   Confirmed ; Classification:   Patient Stated ; Code:   [id number] ; Contributor System:   PowerChart ; Last Updated:   05/23/2018 4:00 EDT ; Life Cycle Date:   05/23/2018 ; Life Cycle Status:   Active ; Vocabulary:   SNOMED CT        Hypercholesteremia (SNOMED CT  :76716984 )  Name of Problem:   Hypercholesteremia ; Recorder:   GREGOR, RN, NANCY A; Confirmation:   Confirmed ; Classification:   Patient Stated ; Code:   [id number] ; Contributor System:   PowerChart ; Last Updated:   05/23/2018 3:27 EDT ; Life Cycle Date:   05/23/2018 ; Life Cycle Status:   Active ; Vocabulary:   SNOMED CT        Neuropathy (SNOMED CT  :8519779981 )  Name of Problem:   Neuropathy ; Recorder:   GREGOR, RN, NANCY A; Confirmation:   Confirmed ; Classification:  Patient Stated ; Code:   [id number] ;  Contributor System:   PowerChart ; Last Updated:   05/23/2018 3:28 EDT ; Life Cycle Date:   05/23/2018 ; Life Cycle Status:   Active ; Vocabulary:   SNOMED CT        Sciatic pain (SNOMED CT  :61272986 )  Name of Problem:   Sciatic pain ; Recorder:   GREGOR, RN, NANCY A; Confirmation:   Confirmed ; Classification:   Patient Stated ; Code:   [id number] ; Contributor System:   PowerChart ; Last Updated:   05/23/2018 3:27 EDT ; Life Cycle Date:   05/23/2018 ; Life Cycle Status:   Active ; Vocabulary:   SNOMED CT          Diagnoses(Active)    Chest pain  Date:   03/27/2019 ; Diagnosis Type:   Reason For Visit ; Confirmation:   Complaint of ; Clinical Dx:   Chest pain ; Classification:   Medical ; Clinical Service:   Emergency medicine ; Code:   PNED ; Probability:   0 ; Diagnosis Code:   [id number]

## 2019-03-27 NOTE — ED Notes (Signed)
ED Patient Education Note     Patient Education Materials Follows:  Emergency Medicine     Gastritis, Adult    Gastritis is soreness and swelling (inflammation) of the lining of the stomach. Gastritis can develop as a sudden onset (acute) or long-term (chronic) condition. If gastritis is not treated, it can lead to stomach bleeding and ulcers.      CAUSES    Gastritis occurs when the stomach lining is weak or damaged. Digestive juices from the stomach then inflame the weakened stomach lining. The stomach lining may be weak or damaged due to viral or bacterial infections. One common bacterial infection is the Helicobacter pylori  infection. Gastritis can also result from excessive alcohol consumption, taking certain medicines, or having too much acid in the stomach.     SYMPTOMS    In some cases, there are no symptoms. When symptoms are present, they may include:     Pain or a burning sensation in the upper abdomen.     Nausea.     Vomiting.     An uncomfortable feeling of fullness after eating.     DIAGNOSIS    Your caregiver may suspect you have gastritis based on your symptoms and a physical exam. To determine the cause of your gastritis, your caregiver may perform the following:     Blood or stool tests to check for the H pylori bacterium.     Gastroscopy. A thin, flexible tube (endoscope) is passed down the esophagus and into the stomach. The endoscope has a light and camera on the end. Your caregiver uses the endoscope to view the inside of the stomach.     Taking a tissue sample (biopsy) from the stomach to examine under a microscope.     TREATMENT    Depending on the cause of your gastritis, medicines may be prescribed. If you have a bacterial infection, such as an H pylori infection, antibiotics may be given. If your gastritis is caused by too much acid in the stomach, H2 blockers or antacids may be given. Your caregiver may recommend that you stop taking aspirin, ibuprofen, or other nonsteroidal  anti-inflammatory drugs (NSAIDs).    HOME CARE INSTRUCTIONS     Only take over-the-counter or prescription medicines as directed by your caregiver.     If you were given antibiotic medicines, take them as directed. Finish them even if you start to feel better.     Drink enough fluids to keep your urine clear or pale yellow.      Avoid foods and drinks that make your symptoms worse, such as:    ? Caffeine or alcoholic drinks.    ? Chocolate.    ? Peppermint or mint flavorings.    ? Garlic and onions.    ? Spicy foods.    ? Citrus fruits, such as oranges, lemons, or limes.    ? Tomato-based foods such as sauce, chili, salsa, and pizza.    ? Fried and fatty foods.     Eat small, frequent meals instead of large meals.    SEEK IMMEDIATE MEDICAL CARE IF:     You have black or dark red stools.     You vomit blood or material that looks like coffee grounds.     You are unable to keep fluids down.     Your abdominal pain gets worse.     You have a fever.     You do not feel better after 1 week.        You have any other questions or concerns.    MAKE SURE YOU:     Understand these instructions.     Will watch your condition.     Will get help right away if you are not doing well or get worse.    This information is not intended to replace advice given to you by your health care provider. Make sure you discuss any questions you have with your health care provider.    Document Released: 07/14/2001 Document Revised: 01/19/2012 Document Reviewed: 09/02/2011  Elsevier Interactive Patient Education ?2016 [employer]

## 2019-03-27 NOTE — ED Provider Notes (Signed)
General Medical Problem *ED        Patient:   [name] [name], [name]             MRN: [id number]            FIN: [phone]               Age:   [age] years     Sex:  Female     DOB:  22-Jul-1951   Associated Diagnoses:   Gastritis   Author:   Georg RuddlePOTTER,  J Miran Kautzman      Basic Information   Time seen: Provider Seen (ST)   ED Provider/Time:    Georg RuddlePOTTER,  J Juanita Devincent / 03/27/2019 04:22  .   Additional information: Chief Complaint from Nursing Triage Note   Chief Complaint   No qualifying data available.Marland Kitchen.      History of Present Illness   68 year old female with a history of hypertension COPD, Esophagitis who had an episode of epigastric burning pain yesterday evening she was going to bed because her vomit once this was nonbloody nonbilious-like vomit.  She then went to bed and had a epigastric burning sensation again that woke her from sleep.  She noted improvement with elevating and getting up to walk around she had no associated shortness of breath or dyspnea on exertion or fever chills or body aches.  She had a former history of reflux has been treated with Carafate but she was unable to tolerate this she has not been on any recent antacids and she had a large greasy fatty meal yesterday before going to bed and feels that this provoked her overall symptoms.  Otherwise she endorses no dark stool or other concern she has no other concerning risk factors from an ACS standpoint.  Took some Tums prior to arrival and felt improvement with these meds.      Review of Systems   Constitutional symptoms:  No fever, no chills, no sweats, no generalized weakness, no fatigue, no decreased activity.    Skin symptoms:  No jaundice, no rash.    Eye symptoms:  Negative except as documented in HPI.   ENMT symptoms:  Negative except as documented in HPI.   Respiratory symptoms:  Negative except as documented in HPI.   Cardiovascular symptoms:  No chest pain, no palpitations, no tachycardia, no syncope, no diaphoresis.    Gastrointestinal symptoms:  Abdominal  pain, epigastric, burning, cramping, no nausea, no vomiting, no diarrhea.    Genitourinary symptoms:  Negative except as documented in HPI.   Musculoskeletal symptoms:  No back pain, no Muscle pain, no Joint pain.    Neurologic symptoms:  No headache, no dizziness, no altered level of consciousness, no numbness, no tingling, no focal weakness.    Psychiatric symptoms:  No anxiety,              Additional review of systems information: All other systems reviewed and otherwise negative.      Health Status   Allergies:    Allergic Reactions (Selected)  Unknown  Demerol- Makes me stop breathing.  Sulfa drugs- Unknwon and burn and itch..      Past Medical/ Family/ Social History   Medical history: Reviewed as documented in chart.   Surgical history: Reviewed as documented in chart.   Family history: Not significant.   Social history: Reviewed as documented in chart.   Problem list:    Active Problems (5)  Back pain   CP (chronic pancreatitis)   Hypercholesteremia  Neuropathy   Sciatic pain   .      Physical Examination               Vital Signs   Reviewed Results: Vital Signs(Date Range: 03/26/2019 0:00 EDT - 03/27/2019 5:48 EDT).   Reviewed Results: Basic Oxygen Information(Date Range: 03/26/2019 0:00 EDT - 03/27/2019 5:48 EDT).   General:  Alert, no acute distress.    Glasgow coma scale:  Total score: Total score: 15.   Neurological:  Alert and oriented to person, place, time, and situation, No focal neurological deficit observed.    Skin:  Warm, pink.    Head:  Normocephalic.   Neck:  Supple, trachea midline.    Eye:  Pupils are equal, round and reactive to light, normal conjunctiva.    Ears, nose, mouth and throat:  Oral mucosa moist.   Cardiovascular:  Regular rate and rhythm.   Respiratory:  Lungs are clear to auscultation, respirations are non-labored.    Chest wall:  No tenderness.   Back:  Nontender, Normal range of motion.    Musculoskeletal:  Normal ROM, normal strength.    Gastrointestinal:  Soft, Nontender.     Genitourinary   Psychiatric:  Cooperative.      Medical Decision Making   Electrocardiogram:  Emergency Provider interpretation performed by me, No ST-T changes, no ectopy, normal PR & QRS intervals, Reviewed Results: (Date Range: 03/26/2019 0:00 EDT - 03/27/2019 5:49 EDT).       Reexamination/ Reevaluation   Course: improving.   Pain status: decreased.   Assessment: Patient's overall symptoms dramatically improving find GI cocktail and Pepcid.  I feel as though these preceding symptoms are related to a reflux after recently eating greasy fatty meal.  She has no other concerning ACS symptoms her heart score is low her symptoms are nonexertional she had a former cardiac work-up which is been unremarkable.  I will recommend diet modifications and close follow-up with GI will place her on Pepcid until further evaluation.  If she were to have any further concerning symptoms encouraged her to return immediately.  We will repeat her troponin and EKG and if negative will be appropriate charge home for follow-up as advised..      Impression and Plan   Diagnosis   Gastritis (ICD10-CM K29.70, Discharge, Medical)   Plan   Condition: Improved, Stable.    Disposition: Discharged: to home.    Prescriptions: Launch prescriptions   Pharmacy:  Pepcid 20 mg oral tablet (Prescribe): 20 mg, 1 tabs, Oral, BID, for 7 days, 14 tabs, 0 Refill(s).    Patient was given the following educational materials: Gastritis, Adult.    Follow up with: [facility] [place] Within 2 to 4 days Return to ED if symptoms worsen.    Counseled: Patient, I had a detailed discussion with the patient and/or guardian regarding the historical points/exam findings supporting the discharge diagnosis and need for outpatient followup. Discussed the need to return to the ER if symptoms persist/worsen, or for any questions/concerns that arise at home.    Signature Line     Electronically Signed on 03/27/2019 05:51 AM EDT    ________________________________________________   Georg RuddlePOTTER,  J Demiya Magno               Modified by: Georg RuddlePOTTER,  J Delicia Berens on 03/27/2019 05:50 AM EDT      Modified by: Georg RuddlePOTTER,  J Zineb Glade on 03/27/2019 05:51 AM EDTAddendum by Oletta DarterHURT-MD,  DAVID C on [name] 24, 2020 17:09 EDT  Patient received in sign over with plan for anticipated d/c home pending repeat troponin.  Labs including LFT, LIpase w/out abnormality.  2 hr trop undetectable, pain resolved w/ gi cocktail and pepcid  patient feeling improved, repeat abd exam benign will d/c home as per initial attending's plan w/ return precautions provided.   Signature Line     Electronically Signed on 03/27/2019 05:09 PM EDT   ________________________________________________   HURT-MD,  DAVID C               Modified by: HURT-MD,  DAVID C on 03/27/2019 05:09 PM EDT

## 2019-07-21 LAB — BASIC METABOLIC PANEL
Anion Gap: 12 mmol/L (ref 2–17)
BUN: 8 mg/dL (ref 8–23)
CO2: 26 mmol/L (ref 22–29)
Calcium: 9.4 mg/dL (ref 8.8–10.2)
Chloride: 99 mmol/L (ref 98–107)
Creatinine: 0.9 mg/dL (ref 0.5–0.9)
GFR African American: 77 mL/min/{1.73_m2} — ABNORMAL LOW (ref >=90)
GFR Non-African American: 66 mL/min/{1.73_m2} — ABNORMAL LOW (ref >=90)
Glucose: 129 mg/dL — ABNORMAL HIGH (ref 70–99)
OSMOLALITY CALCULATED: 274 mOsm/kg (ref 270–287)
Potassium: 4 mmol/L (ref 3.5–5.3)
Sodium: 137 mmol/L (ref 135–145)

## 2019-07-21 LAB — CBC WITH AUTO DIFFERENTIAL
Absolute Baso #: 0.1 10*3/uL (ref 0.0–0.2)
Absolute Eos #: 0.6 10*3/uL — ABNORMAL HIGH (ref 0.0–0.5)
Absolute Lymph #: 1.8 10*3/uL (ref 1.0–3.2)
Absolute Mono #: 0.5 10*3/uL (ref 0.3–1.0)
Basophils %: 0.7 % (ref 0.0–2.0)
Eosinophils %: 8.4 % — ABNORMAL HIGH (ref 0.0–7.0)
Hematocrit: 38.9 % (ref 34.0–47.0)
Hemoglobin: 13.1 g/dL (ref 11.5–15.7)
Immature Grans (Abs): 0 10*3/uL (ref 0.00–0.06)
Immature Granulocytes: 0 % — ABNORMAL LOW (ref 0.1–0.6)
Lymphocytes: 25.7 % (ref 15.0–45.0)
MCH: 31.1 pg (ref 27.0–34.5)
MCHC: 33.7 g/dL (ref 32.0–36.0)
MCV: 92.4 fL (ref 81.0–99.0)
MPV: 9 fL (ref 7.2–13.2)
Monocytes: 6.7 % (ref 4.0–12.0)
Neutrophils %: 58.5 % (ref 42.0–74.0)
Neutrophils Absolute: 4.2 10*3/uL (ref 1.6–7.3)
Platelets: 270 10*3/uL (ref 140–440)
RBC: 4.21 x10e6/mcL (ref 3.60–5.20)
RDW: 12.9 % (ref 11.0–16.0)
WBC: 7.2 10*3/uL (ref 3.8–10.6)

## 2019-07-21 LAB — TROPONIN T: Troponin T: <0.01 ng/mL (ref 0.000–0.010)

## 2019-07-21 LAB — CK: Total CK: 145 U/L (ref 20–180)

## 2019-07-21 LAB — TROPONIN: TROP 5TH GEN STUDY: <6 ng/L (ref 0.0–12.0)

## 2019-07-21 NOTE — ED Notes (Signed)
ED Triage Note       ED Triage Adult Entered On:  07/21/2019 15:59 EST    Performed On:  07/21/2019 15:56 EST by Ace, RN, Anderson Malta               Triage   Numeric Rating Pain Scale :   7   Temperature Oral :   36.7 degC(Converted to: 98.1 degF)    Heart Rate Monitored :   85 bpm   Respiratory Rate :   17 br/min   Systolic Blood Pressure :   121 mmHg   Diastolic Blood Pressure :   70 mmHg   SpO2 :   100 %   Oxygen Therapy :   Room air   ED Allergies Section :   Document assessment   ED Quick Assessment :   Patient appears awake, alert, oriented to baseline. Skin warm and dry. Moves all extremities. Respiration even and unlabored. Appears in no apparent distress.   Chief Complaint :   left ant chestpain when leans over, hurts with palpation started 4 days ago,has to hold breast when bends over  no fevers, pt is smoker   Ace, RN, Anderson Malta - 07/21/2019 16:01 EST   [place] Mode of Arrival :   Private vehicle   Infectious Disease Documentation :   Document assessment   Patient presentation :   None of the above   Chief Complaint or Presentation suggest infection :   Yes   Weight Dosing :   97.9 kg(Converted to: 215 lb 13 oz)    Height :   166 cm(Converted to: 5 ft 5 in)    Body Mass Index Dosing :   36 kg/m2   Ace, RN, Anderson Malta - 07/21/2019 15:56 EST   DCP GENERIC CODE   Tracking Group :   ED [employer]   Tracking Acuity :   3   Ace, RN, Anderson Malta - 07/21/2019 15:56 EST   ED General Section :   Document assessment   Pregnancy Status :   N/A   ED Reason for Visit Section :   Document assessment   Ace, RN, Anderson Malta - 07/21/2019 15:56 EST   ID Risk Screen Symptoms   Recent Travel History :   No recent travel   Close Contact with COVID-19 ID :   No   Last 14 days COVID-19 ID :   No   TB Symptom Screen :   No symptoms   C. diff Symptom/History ID :   Neither of the above   Ace, RN, Anderson Malta - 07/21/2019 15:56 EST   Allergies   (As Of: 07/21/2019 16:03:35 EST)   Allergies (Active)   Demerol  Estimated Onset  Date:   Unspecified ; Reactions:   makes me stop breathing ; Created By:   Karolee Ohs, RN, NANCY A; Reaction Status:   Active ; Category:   Drug ; Substance:   Demerol ; Type:   Allergy ; Severity:   Unknown ; Updated By:   Karolee Ohs RN, Erma Heritage; Reviewed Date:   07/21/2019 16:01 EST      sulfa drugs  Estimated Onset Date:   Unspecified ; Reactions:   unknwon, burn and itch ; Created By:   Karolee Ohs, RN, NANCY A; Reaction Status:   Active ; Category:   Drug ; Substance:   sulfa drugs ; Type:   Allergy ; Severity:   Unknown ; Updated By:   Karolee Ohs, RN, NANCY A; Reviewed  Date:   07/21/2019 16:01 EST        Psycho-Social   Last 3 mo, thoughts killing self/others :   Patient denies   Feels Unsafe at Home :   No   ED Behavioral Activity Rating Scale :   4 - Quiet and awake (normal level of activity)   Ace, RN, Maricela Bo - 07/21/2019 15:56 EST   ED Reason for Visit   (As Of: 07/21/2019 15:59:42 EST)   Problems(Active)    Back pain (SNOMED CT  :811914782 )  Name of Problem:   Back pain ; Recorder:   GREGOR, RN, NANCY A; Confirmation:   Confirmed ; Classification:   Patient Stated ; Code:   [id number] ; Contributor System:   Conservation officer, nature ; Last Updated:   05/23/2018 3:27 EDT ; Life Cycle Date:   05/23/2018 ; Life Cycle Status:   Active ; Vocabulary:   SNOMED CT        CP (chronic pancreatitis) (SNOMED CT  :578469629 )  Name of Problem:   CP (chronic pancreatitis) ; Recorder:   Andree Elk, RN, Palmo T; Confirmation:   Confirmed ; Classification:   Patient Stated ; Code:   [id number] ; Contributor System:   Conservation officer, nature ; Last Updated:   05/23/2018 4:00 EDT ; Life Cycle Date:   05/23/2018 ; Life Cycle Status:   Active ; Vocabulary:   SNOMED CT        Hypercholesteremia (SNOMED CT  :01027253 )  Name of Problem:   Hypercholesteremia ; Recorder:   GREGOR, RN, NANCY A; Confirmation:   Confirmed ; Classification:   Patient Stated ; Code:   [id number] ; Contributor System:   Conservation officer, nature ; Last Updated:   05/23/2018 3:27 EDT ; Life Cycle Date:   05/23/2018  ; Life Cycle Status:   Active ; Vocabulary:   SNOMED CT        Neuropathy (SNOMED CT  :4259563875 )  Name of Problem:   Neuropathy ; Recorder:   GREGOR, RN, NANCY A; Confirmation:   Confirmed ; Classification:   Patient Stated ; Code:   [id number] ; Contributor System:   Conservation officer, nature ; Last Updated:   05/23/2018 3:28 EDT ; Life Cycle Date:   05/23/2018 ; Life Cycle Status:   Active ; Vocabulary:   SNOMED CT        Sciatic pain (SNOMED CT  :41660630 )  Name of Problem:   Sciatic pain ; Recorder:   GREGOR, RN, NANCY A; Confirmation:   Confirmed ; Classification:   Patient Stated ; Code:   [id number] ; Contributor System:   PowerChart ; Last Updated:   05/23/2018 3:27 EDT ; Life Cycle Date:   05/23/2018 ; Life Cycle Status:   Active ; Vocabulary:   SNOMED CT          Diagnoses(Active)    Chest pain  Date:   07/21/2019 ; Diagnosis Type:   Reason For Visit ; Confirmation:   Complaint of ; Clinical Dx:   Chest pain ; Classification:   Medical ; Clinical Service:   Emergency medicine ; Code:   PNED ; Probability:   0 ; Diagnosis Code:   [id number]

## 2019-07-21 NOTE — Discharge Summary (Signed)
 ED Clinical Summary                         [facility]  [address]  [place], [place] [address]  [phone]           PERSON INFORMATION  Name: [name] [name], [name] [name] Age:  68 Years DOB: 1951-07-14   Sex: Female Language: English PCP: STEPHEN CAMIE JACOBUS   Marital Status:  Married Phone: [phone] Med Service: MED-Medicine   MRN:  [id number] Acct# [id number] Arrival: 07/21/2019 15:49:00   Visit Reason: Chest pain; CHEST PRESSURE Acuity: 3 LOS: 000 04:50   Address:      [address] [place] [address]  Diagnosis:      Chest pain in adult  Printed Prescriptions:            Allergies      sulfa drugs (unknwon) (burn and itch)      Demerol (makes me stop breathing)      Medications Administered During Visit:                  Medication Dose Route   Sodium Chloride 0.9% 1000 mL IV       Patient Medication List:              albuterol (albuterol CFC free 90 mcg/inh inhalation aerosol) 1 Puffs Inhale (breathe in) once as needed for wheezing.  amoxicillin (amoxicillin 500 mg oral capsule) TAKE ONE CAPSULE BY MOUTH EVERY 8 HOURS UNTIL GONE.  ezetimibe (ezetimibe 10 mg oral tablet)  famotidine (Pepcid 20 mg oral tablet) 1 Tabs Oral (given by mouth) 2 times a day for 7 Days. Refills: 0.  FLUoxetine (FLUoxetine 20 mg oral tablet) 1 Tabs Oral (given by mouth) every day., DO NOT CRUSH     SOUND ALIKE / LOOK ALIKE - VERIFY DRUG  fluticasone-salmeterol (Advair Diskus 100 mcg-50 mcg inhalation powder) 2 times a day.  HYDROcodone-acetaminophen (Norco 325 mg-7.5 mg oral tablet) 1 Tabs Oral (given by mouth) 3 times a day.  hydrOXYzine (hydrOXYzine hydrochloride 25 mg oral tablet)  methocarbamol (methocarbamol 750 mg oral tablet) TAKE 1 TABLET BY MOUTH THREE TIMES DAILY.  morphine (morphine 15 mg/8 hr oral tablet, extended release) TAKE 1 TABLET BY MOUTH TWICE DAILY.  multivitamin (Vitamin B Complex oral capsule) 1 Capsules Oral (given by mouth) every day.  multivitamin, prenatal  (Prenatal Multivitamins oral tablet)  oxyCODONE (Xtampza ER 13.5 mg oral capsule, extended release) 1 Capsules Oral (given by mouth) every 12 hours.  pregabalin (pregabalin 200 mg oral capsule)  rosuvastatin (rosuvastatin 40 mg oral tablet) 1 Tabs Oral (given by mouth) every day.  tiZANidine 4 Milligram Oral (given by mouth) every 8 hours.         Major Tests and Procedures:  The following procedures and tests were performed during your ED visit.  COMMONPROCEDURES%>  COMMON PROCEDURESCOMMENTS%>          Laboratory Orders  Name Status Details   .Trop 5th Gen Completed Blood, Stat, ST - Stat, Collected, 07/21/19 17:57:00 EST, 07/21/19 17:57:00 EST, Nurse collect, 07/21/19 17:57:00 EST, SADIE MAUDE RAMAN, [id number]   .Trop 5th Gen Completed Blood, Stat, ST - Stat, Collected, 07/21/19 19:59:00 EST, 07/21/19 19:59:00 EST, Nurse collect, 07/21/19 19:59:00 EST, Darcia, RN, Darryle HERO, [id number]   Add On Ordered Blood, Stat, ST - Stat, Collected, 07/21/19 19:14:00 EST, 07/21/19 19:14:00 EST, Ewers, RN, Darryle HERO, Print label Y/N, ddimer, Draw Stat  BMP Completed Blood, Stat, ST - Stat, 07/21/19 17:49:00 EST, 07/21/19 17:49:00 EST, Nurse collect, SADIE,  PETER S, Print label Y/N   CBCDIFF Completed Blood, Stat, ST - Stat, 07/21/19 17:49:00 EST, 07/21/19 17:49:00 EST, Nurse collect, SADIE,  PETER S, Print label Y/N   CPK Completed Blood, Stat, ST - Stat, 07/21/19 17:49:00 EST, 07/21/19 17:49:00 EST, Nurse collect, SADIE MAUDE RAMAN, Print label Y/N   D Dimer Completed Blood, Stat, ST - Stat, Collected, 07/21/19 17:57:00 EST [id number], 07/21/19 17:57:00 EST, Nurse collect, Venous Draw, 07/21/19 19:18:00 EST, BH CP Login, FORRESTER-PA-C,  PETER S, Print label Y/N, 1.8 mL Aolz/*[id number]*/   Trop T Completed Blood, Stat, ST - Stat, 07/21/19 17:49:00 EST, 07/21/19 17:49:00 EST, Nurse collect, SADIE MAUDE S, Print label Y/N   Trop T Completed Blood, Stat, ST - Stat, 07/21/19 19:41:00  EST, 07/21/19 19:41:00 EST, Nurse collect, Darcia, RN, Darryle HERO, Print label Y/N               Radiology Orders  Name Status Details   XR Chest 2 Views Completed 07/21/19 17:49:00 EST, STAT 1 hour or less, Reason: Chest pain, Transport Mode: PORTABLE, pp_set_radiology_subspecialty               Patient Care Orders  Name Status Details   Blood Pressure Completed 07/21/19 17:49:00 EST, Once, 07/21/19 17:49:00 EST, Check BP both arms   Cardiac/NIBP/Pulse Ox Monitoring Completed 07/21/19 17:49:00 EST, This message can only be seen by Nursing, it is not visible to Pharmacy, Laboratory, or Radiology., 07/21/19 17:49:00 EST, 07/21/19 17:49:00 EST, Once   Discharge Patient Ordered 07/21/19 20:25:00 EST   ED Assessment Adult Completed 07/21/19 16:03:36 EST, 07/21/19 16:03:36 EST   ED Only Oxygen Therapy Completed 07/21/19 17:49:00 EST, STAT 1 hour or less, 07/21/19 17:49:00 EST, Keep SAT > 92%   ED Secondary Triage Completed 07/21/19 16:03:36 EST, 07/21/19 16:03:36 EST   ED Triage Adult Completed 07/21/19 15:49:10 EST, 07/21/19 15:49:10 EST   Saline Lock Insert Completed 07/21/19 17:49:00 EST, Once, 07/21/19 17:49:00 EST             PROVIDER INFORMATION               Provider Role Assigned Sampson Darcia, RN, Darryle HERO ED Nurse 07/21/2019 17:36:35    SADIE MAUDE RAMAN ED MidLevel 07/21/2019 17:37:09        Attending Physician:  SADIE MAUDE RAMAN     Admit Doc  FORRESTER-PA-C,  PETER S     Consulting Doc       VITALS INFORMATION  Vital Sign Triage Latest   Temp Oral ORAL_1%>36.7 degC ORAL%>36.7 degC   Temp Temporal TEMPORAL_1%> TEMPORAL%>   Temp Intravascular INTRAVASCULAR_1%> INTRAVASCULAR%>   Temp Axillary AXILLARY_1%> AXILLARY%>   Temp Rectal RECTAL_1%> RECTAL%>   02 Sat 100 % 97 %   Respiratory Rate RATE_1%>17 br/min RATE%>16 br/min   Peripheral Pulse Rate PULSE RATE_1%>73 bpm PULSE RATE%>73 bpm   Apical Heart Rate HEART RATE_1%> HEART RATE%>   Blood Pressure BLOOD PRESSURE_1%>/ BLOOD PRESSURE_1%>70 mmHg  BLOOD PRESSURE%>117 mmHg / BLOOD PRESSURE%>68 mmHg                 Immunizations      No Immunizations Documented This Visit          DISCHARGE INFORMATION   Discharge Disposition: H Outpt-Sent Home   Discharge Location:    Home   Discharge Date and Time:    07/21/2019 20:39:35   ED Checkout Date  and Time:    07/21/2019 20:39:35     DEPART REASON INCOMPLETE INFORMATION               Depart Action Incomplete Reason   Interactive View/I&O Recently assessed               Problems      No Problems Documented              Smoking Status      10 or more cigarettes (1/2 pack or more)/day in last 30 days         PATIENT EDUCATION INFORMATION  Instructions:       Chest Wall Pain, Easy-to-Read; Nonspecific Chest Pain     Follow up:                    With: Address: When:   [name] [address] [place], [place] [address]  [phone] Business (1) Within 1 week           ED PROVIDER DOCUMENTATION     Patient:   [name] [name], [name] [name]             MRN: [id number]            FIN: [id number]               Age:   23 years     Sex:  Female     DOB:  July 23, 1951   Associated Diagnoses:   Chest pain in adult   Author:   SADIE MAUDE RAMAN      Basic Information   Time seen: Provider Seen (ST)   ED Provider/Time:    SADIE MAUDE RAMAN / 07/21/2019 17:37  , Date & time 07/21/2019 18:04:00.   History source: Patient.   Arrival mode: Private vehicle.   History limitation: None.   Additional information: Chief Complaint from Nursing Triage Note   Chief Complaint  Chief Complaint: left ant chestpain when leans over, hurts with palpation started 4 days ago,has to hold breast when bends over  no fevers, pt is smoker (07/21/19 15:56:00).      History of Present Illness   The patient presents with chest pain.  The onset was 4  days ago.  The course/duration of symptoms is constant and fluctuating in intensity.  Location: Generalized left anterior chest. Radiating pain: left shoulder. The character of symptoms is sharp.  The degree  at onset was minimal.  The degree at maximum was minimal.  The degree at present is minimal.  The exacerbating factor is leaning over.  The relieving factor is rest but not leaning forward.  Risk factors consist of hyperlipidemia and hx of pancreatitis.  Prior episodes: non-cardiac.  Therapy today None.  Associated symptoms: none.  Additional history: Patient presents for evaluation for left sided chest pain that started approx 4 days ago. Patient states that her pain is constant but increases when she leans forward. Patient denies acute SOB, n/v, fever, or cough. Patient denies known hx of cardiac abnormalities.        Review of Systems   Constitutional symptoms:  Negative except as documented in HPI.   Cardiovascular symptoms:  Chest pain.             Additional review of systems information: All other systems reviewed and otherwise negative, All systems reviewed as documented in chart.      Health Status   Allergies:    Allergic Reactions (Selected)  Unknown  Demerol- Makes me stop breathing.  Sulfa drugs- Unknwon and burn and itch..   Medications:  (Selected)   Inpatient Medications  Ordered  Sodium Chloride 0.9% bolus: 1,000 mL, 1000 mL/hr, IV, Once  Prescriptions  Prescribed  Pepcid 20 mg oral tablet: 20 mg, 1 tabs, Oral, BID, for 7 days, 14 tabs, 0 Refill(s)  Documented Medications  Documented  Advair Diskus 100 mcg-50 mcg inhalation powder: inh, BID, 0 Refill(s)  FLUoxetine 20 mg oral tablet: 20 mg, 1 tabs, Oral, Daily, 30 tabs, 0 Refill(s)  Norco 325 mg-7.5 mg oral tablet: 1 tabs, Oral, TID, 0 Refill(s)  Prenatal Multivitamins oral tablet: 0 Refill(s)  Vitamin B Complex oral capsule: 1 caps, Oral, Daily, 30 caps, 0 Refill(s)  Xtampza ER 13.5 mg oral capsule, extended release: 13.5 mg, 1 caps, Oral, q12hr, 0 Refill(s)  albuterol CFC free 90 mcg/inh inhalation aerosol: 1 puffs, Inhale, Once, PRN: for wheezing, 18 g, 0 Refill(s)  amoxicillin 500 mg oral capsule: TAKE ONE CAPSULE BY MOUTH EVERY 8 HOURS UNTIL  GONE  ezetimibe 10 mg oral tablet: 0 Refill(s)  hydrOXYzine hydrochloride 25 mg oral tablet: 0 Refill(s)  methocarbamol 750 mg oral tablet: TAKE 1 TABLET BY MOUTH THREE TIMES DAILY  morphine 15 mg/8 hr oral tablet, extended release: TAKE 1 TABLET BY MOUTH TWICE DAILY  pregabalin 200 mg oral capsule: 0 Refill(s)  rosuvastatin 40 mg oral tablet: 40 mg, 1 tabs, Oral, Daily, 0 Refill(s)  tiZANidine: 4 mg, Oral, q8hr, 0 Refill(s).      Past Medical/ Family/ Social History   Problem list:    Active Problems (5)  Back pain   CP (chronic pancreatitis)   Hypercholesteremia   Neuropathy   Sciatic pain   , per nurse's notes.      Physical Examination               Vital Signs   Vital Signs   07/21/2019 18:04 EST Respiratory Rate 16 br/min   07/21/2019 18:02 EST Systolic Blood Pressure 132 mmHg    Diastolic Blood Pressure 77 mmHg    Peripheral Pulse Rate 73 bpm    Heart Rate Monitored 73 bpm    Mean Arterial Pressure, Cuff 105 mmHg  HI    SpO2 98 %   07/21/2019 15:56 EST Systolic Blood Pressure 121 mmHg    Diastolic Blood Pressure 70 mmHg    Temperature Oral 36.7 degC    Heart Rate Monitored 85 bpm    Respiratory Rate 17 br/min    SpO2 100 %   .   Measurements   07/21/2019 16:03 EST Body Mass Index est meas 35.53 kg/m2    Body Mass Index Measured 35.53 kg/m2   07/21/2019 15:56 EST Height/Length Measured 166 cm    Weight Dosing 97.9 kg   .   Basic Oxygen Information   07/21/2019 18:02 EST Oxygen Therapy Room air    SpO2 98 %   07/21/2019 15:56 EST Oxygen Therapy Room air    SpO2 100 %   .   General:  Alert, no acute distress.    Skin:  Warm, dry.    Head:  Normocephalic.   Neck:  Supple, no tenderness.    Eye:  Pupils are equal, round and reactive to light, extraocular movements are intact.    Ears, nose, mouth and throat:  Oral mucosa moist.   Cardiovascular:  Regular rate and rhythm.   Respiratory:  Lungs are clear to auscultation, respirations are non-labored.       Medical  Decision Making   Differential Diagnosis:  Atypical  chest pain, pneumonia, pleurisy, chest wall pain.    Rationale:  PA/NP reviewed with co-signing physician: any ECG, lab results, radiology studies, medication, diagnosis, and plan of care.   Documents reviewed:  Emergency department nurses' notes.   Results review:  Lab results : Lab View   07/21/2019 19:59 EST Trop T Quant <0.010 ng/mL   07/21/2019 18:25 EST Estimated Creatinine Clearance 70.71 mL/min   07/21/2019 17:57 EST WBC 7.2 x10e3/mcL    RBC 4.21 x10e6/mcL    Hgb 13.1 g/dL    HCT 61.0 %    MCV 07.5 fL    MCH 31.1 pg    MCHC 33.7 g/dL    RDW 87.0 %    Platelet 270 x10e3/mcL    MPV 9.0 fL    Neutro Auto 58.5 %    Neutro Absolute 4.2 x10e3/mcL    Immature Grans Percent 0.0 %  LOW    Immature Grans Absolute 0.00 x10e3/mcL    Lymph Auto 25.7 %    Lymph Absolute 1.8 x10e3/mcL    Mono Auto 6.7 %    Mono Absolute 0.5 x10e3/mcL    Eosinophil Percent 8.4 %  HI    Eos Absolute 0.6 x10e3/mcL  HI    Basophil Auto 0.7 %    Baso Absolute 0.1 x10e3/mcL    D Dimer 0.46 mcg/mL FEU    Sodium Lvl 137 mmol/L    Potassium Lvl 4.0 mmol/L    Chloride 99 mmol/L    CO2 26 mmol/L    Glucose Random 129 mg/dL  HI    BUN 8 mg/dL    Creatinine Lvl 0.9 mg/dL    AGAP 12 mmol/L    Osmolality Calc 274 mOsm/kg    Calcium Lvl 9.4 mg/dL    eGFR AA 77 fO/fpw/8.26f  LOW    eGFR Non-AA 66 mL/min/1.51m  LOW    Trop T Quant <0.010 ng/mL    Trop 5th Gen Study <6.0 ng/L    Total CK 145 unit/L   07/21/2019 16:03 EST Estimated Creatinine Clearance 63.64 mL/min   .   Notes:  I reviewed patient's past medical record, surgical records, and ED nurses note. .   Radiology results:  Rad Results (ST)   XR Chest 2 Views  ?  07/21/19 18:16:55  Chest PA and lateral: 07/21/19    INDICATION:Chest pain.    COMPARISON: 03/27/2019    FINDINGS:    Lungs: Lungs are clear.    Pleura: No pleural effusions. No pneumothorax.    Cardiomediastinum: Unremarkable.    Bones: No acute osseous abnormality.    IMPRESSION:  No radiographic evidence of acute cardiopulmonary  process.  ?  Signed By: JACKALYN ANDRES ONEIDA SABRA      Reexamination/ Reevaluation   Vital signs   Basic Oxygen Information   07/21/2019 18:02 EST Oxygen Therapy Room air    SpO2 98 %   07/21/2019 15:56 EST Oxygen Therapy Room air    SpO2 100 %      Course: improving.   Pain status: decreased.   Assessment: exam improved.      Impression and Plan   Diagnosis   Chest pain in adult (ICD10-CM R07.9, Discharge, Medical)   Plan   Condition: Improved, Stable.    Disposition: Discharged: Time  07/21/2019 20:21:00, to home.    Patient was given the following educational materials: Nonspecific Chest Pain, Chest Wall Pain, Easy-to-Read, Chest Wall Pain, Easy-to-Read, Nonspecific Chest Pain.  Follow up with: [name] Within 1 week.    Counseled: Patient, Regarding diagnosis, Regarding diagnostic results, Regarding treatment plan, Regarding prescription, Patient indicated understanding of instructions.    Notes: I had a lengthy discussion with the patient regarding all test results. Patient is to follow-up with her primary care physician for reevaluation of current symptoms and continued management. Patient is to follow-up with the ER symptoms worsen or new symptoms develop., patient was also advised to follow-up with cardiology for reevaluation of current symptoms.  Patient may follow-up in the ER symptoms worsen or new symptom develop.SABRA

## 2019-07-21 NOTE — ED Notes (Signed)
ED Triage Note       ED Secondary Triage Entered On:  07/21/2019 17:40 EST    Performed On:  07/21/2019 17:38 EST by Leitha Bleak, RN, Lavonne Chick               General Information   Barriers to Learning :   None evident   Languages :   English   ED Home Meds Section :   Document assessment   [facility] ED Fall Risk Section :   Document assessment   ED History Section :   Document assessment   ED Advance Directives Section :   Document assessment   ED Palliative Screen :   Document assessment   Leitha Bleak RNLavonne Chick - 07/21/2019 17:38 EST   (As Of: 07/21/2019 17:40:02 EST)   Problems(Active)    Back pain (SNOMED CT  :161096045 )  Name of Problem:   Back pain ; Recorder:   GREGOR, RN, NANCY A; Confirmation:   Confirmed ; Classification:   Patient Stated ; Code:   409811914 ; Contributor System:   Dietitian ; Last Updated:   05/23/2018 3:27 EDT ; Life Cycle Date:   05/23/2018 ; Life Cycle Status:   Active ; Vocabulary:   SNOMED CT        CP (chronic pancreatitis) (SNOMED CT  :782956213 )  Name of Problem:   CP (chronic pancreatitis) ; Recorder:   Pernell Dupre, RN, Palmo T; Confirmation:   Confirmed ; Classification:   Patient Stated ; Code:   [id number] ; Contributor System:   Dietitian ; Last Updated:   05/23/2018 4:00 EDT ; Life Cycle Date:   05/23/2018 ; Life Cycle Status:   Active ; Vocabulary:   SNOMED CT        Hypercholesteremia (SNOMED CT  :62952841 )  Name of Problem:   Hypercholesteremia ; Recorder:   GREGOR, RN, NANCY A; Confirmation:   Confirmed ; Classification:   Patient Stated ; Code:   [id number] ; Contributor System:   Dietitian ; Last Updated:   05/23/2018 3:27 EDT ; Life Cycle Date:   05/23/2018 ; Life Cycle Status:   Active ; Vocabulary:   SNOMED CT        Neuropathy (SNOMED CT  :7253664403 )  Name of Problem:   Neuropathy ; Recorder:   GREGOR, RN, NANCY A; Confirmation:   Confirmed ; Classification:   Patient Stated ; Code:   [id number] ; Contributor System:   Dietitian ; Last Updated:   05/23/2018 3:28 EDT ; Life  Cycle Date:   05/23/2018 ; Life Cycle Status:   Active ; Vocabulary:   SNOMED CT        Sciatic pain (SNOMED CT  :75643329 )  Name of Problem:   Sciatic pain ; Recorder:   GREGOR, RN, NANCY A; Confirmation:   Confirmed ; Classification:   Patient Stated ; Code:   [id number] ; Contributor System:   PowerChart ; Last Updated:   05/23/2018 3:27 EDT ; Life Cycle Date:   05/23/2018 ; Life Cycle Status:   Active ; Vocabulary:   SNOMED CT          Diagnoses(Active)    Chest pain  Date:   07/21/2019 ; Diagnosis Type:   Reason For Visit ; Confirmation:   Complaint of ; Clinical Dx:   Chest pain ; Classification:   Medical ; Clinical Service:   Emergency medicine ; Code:   PNED ; Probability:   0 ; Diagnosis Code:   [id number]             -  Procedure History   (As Of: 07/21/2019 17:40:02 EST)     Anesthesia Minutes:   0 ; Procedure Name:   Cesarean section ; Procedure Minutes:   0            Anesthesia Minutes:   0 ; Procedure Name:   Cholecystectomy ; Procedure Minutes:   0            Anesthesia Minutes:   0 ; Procedure Name:   Appendectomy ; Procedure Minutes:   0            Anesthesia Minutes:   0 ; Procedure Name:   Hysterectomy ; Procedure Minutes:   0            Anesthesia Minutes:   0 ; Procedure Name:   Gastric sleeve surgery ; Procedure Minutes:   0            [facility] Fall Risk Assessment Tool   Hx of falling last 3 months ED Fall :   No   Patient confused or disoriented ED Fall :   No   Patient intoxicated or sedated ED Fall :   No   Patient impaired gait ED Fall :   No   Use a mobility assistance device ED Fall :   No   Patient altered elimination ED Fall :   No   [facility] ED Fall Score :   0    Ewers, RLavonne Chick, Haley M - 07/21/2019 17:38 EST   ED Advance Directive   Advance Directive :   No   Ewers, RN, Lavonne ChickHaley M - 07/21/2019 17:38 EST   Social History   Social History   (As Of: 07/21/2019 17:40:02 EST)   Tobacco:        Tobacco use: 10 or more cigarettes (1/2 pack or more)/day in last 30 days.   Cigarettes   (Last Updated: 05/23/2018 03:58:00 EDT by Pernell DupreAdams, RN, Palmo T)            Med Hx   Medication List   (As Of: 07/21/2019 17:40:02 EST)   Prescription/Discharge Order    famotidine  :   famotidine ; Status:   Prescribed ; Ordered As Mnemonic:   Pepcid 20 mg oral tablet ; Simple Display Line:   20 mg, 1 tabs, Oral, BID, for 7 days, 14 tabs, 0 Refill(s) ; Ordering Provider:   Georg RuddlePOTTER,  J ANDREW; Catalog Code:   famotidine ; Order Dt/Tm:   03/27/2019 05:49:53 EDT            Home Meds    amoxicillin  :   amoxicillin ; Status:   Documented ; Ordered As Mnemonic:   amoxicillin 500 mg oral capsule ; Simple Display Line:   TAKE ONE CAPSULE BY MOUTH EVERY 8 HOURS UNTIL GONE ; Catalog Code:   amoxicillin ; Order Dt/Tm:   07/21/2019 17:39:07 EST          methocarbamol  :   methocarbamol ; Status:   Documented ; Ordered As Mnemonic:   methocarbamol 750 mg oral tablet ; Simple Display Line:   TAKE 1 TABLET BY MOUTH THREE TIMES DAILY ; Catalog Code:   methocarbamol ; Order Dt/Tm:   07/21/2019 17:39:07 EST          morphine  :   morphine ; Status:   Documented ; Ordered As Mnemonic:   morphine 15 mg/8 hr oral tablet, extended release ; Simple Display Line:   TAKE 1 TABLET BY MOUTH TWICE DAILY ;  Catalog Code:   morphine ; Order Dt/Tm:   07/21/2019 17:39:07 EST          albuterol  :   albuterol ; Status:   Documented ; Ordered As Mnemonic:   albuterol CFC free 90 mcg/inh inhalation aerosol ; Simple Display Line:   1 puffs, Inhale, Once, PRN: for wheezing, 18 g, 0 Refill(s) ; Catalog Code:   albuterol ; Order Dt/Tm:   07/21/2019 17:39:07 EST          fluticasone-salmeterol  :   fluticasone-salmeterol ; Status:   Documented ; Ordered As Mnemonic:   Advair Diskus 100 mcg-50 mcg inhalation powder ; Simple Display Line:   inh, BID, 0 Refill(s) ; Catalog Code:   fluticasone-salmeterol ; Order Dt/Tm:   03/27/2019 04:35:01 EDT          hydrOXYzine  :   hydrOXYzine ; Status:   Documented ; Ordered As Mnemonic:   hydrOXYzine  hydrochloride 25 mg oral tablet ; Simple Display Line:   0 Refill(s) ; Catalog Code:   hydrOXYzine ; Order Dt/Tm:   03/27/2019 04:35:07 EDT          ezetimibe  :   ezetimibe ; Status:   Documented ; Ordered As Mnemonic:   ezetimibe 10 mg oral tablet ; Simple Display Line:   0 Refill(s) ; Catalog Code:   ezetimibe ; Order Dt/Tm:   03/27/2019 04:35:07 EDT          pregabalin  :   pregabalin ; Status:   Documented ; Ordered As Mnemonic:   pregabalin 200 mg oral capsule ; Simple Display Line:   0 Refill(s) ; Catalog Code:   pregabalin ; Order Dt/Tm:   03/27/2019 04:35:07 EDT          FLUoxetine  :   FLUoxetine ; Status:   Documented ; Ordered As Mnemonic:   FLUoxetine 20 mg oral tablet ; Simple Display Line:   20 mg, 1 tabs, Oral, Daily, 30 tabs, 0 Refill(s) ; Catalog Code:   FLUoxetine ; Order Dt/Tm:   05/23/2018 04:02:26 EDT ; Comment:   DO NOT CRUSH     SOUND ALIKE / LOOK ALIKE - VERIFY DRUG          tiZANidine  :   tiZANidine ; Status:   Documented ; Ordered As Mnemonic:   tiZANidine ; Simple Display Line:   4 mg, Oral, q8hr, 0 Refill(s) ; Catalog Code:   tiZANidine ; Order Dt/Tm:   05/23/2018 04:02:16 EDT          HYDROcodone-acetaminophen  :   HYDROcodone-acetaminophen ; Status:   Documented ; Ordered As Mnemonic:   Norco 325 mg-7.5 mg oral tablet ; Simple Display Line:   1 tabs, Oral, TID, 0 Refill(s) ; Catalog Code:   HYDROcodone-acetaminophen ; Order Dt/Tm:   05/23/2018 03:32:32 EDT          multivitamin  :   multivitamin ; Status:   Documented ; Ordered As Mnemonic:   Vitamin B Complex oral capsule ; Simple Display Line:   1 caps, Oral, Daily, 30 caps, 0 Refill(s) ; Catalog Code:   multivitamin ; Order Dt/Tm:   05/23/2018 03:31:05 EDT          multivitamin, prenatal  :   multivitamin, prenatal ; Status:   Documented ; Ordered As Mnemonic:   Prenatal Multivitamins oral tablet ; Simple Display Line:   0 Refill(s) ; Catalog Code:   multivitamin, prenatal ; Order Dt/Tm:   05/23/2018 03:31:25 EDT  oxyCODONE  :    oxyCODONE ; Status:   Documented ; Ordered As Mnemonic:   Xtampza ER 13.5 mg oral capsule, extended release ; Simple Display Line:   13.5 mg, 1 caps, Oral, q12hr, 0 Refill(s) ; Catalog Code:   oxyCODONE ; Order Dt/Tm:   05/23/2018 03:30:08 EDT          rosuvastatin  :   rosuvastatin ; Status:   Documented ; Ordered As Mnemonic:   rosuvastatin 40 mg oral tablet ; Simple Display Line:   40 mg, 1 tabs, Oral, Daily, 0 Refill(s) ; Catalog Code:   rosuvastatin ; Order Dt/Tm:   05/23/2018 03:30:29 EDT

## 2019-07-21 NOTE — ED Notes (Signed)
ED Patient Education Note     Patient Education Materials Follows:  Cardiovascular     Nonspecific Chest Pain    Chest pain can be caused by many different conditions. There is always a chance that your pain could be related to something serious, such as a heart attack or a blood clot in your lungs. Chest pain can also be caused by conditions that are not life-threatening. If you have chest pain, it is very important to follow up with your health care provider.      CAUSES    Chest pain can be caused by:     Heartburn.     Pneumonia or bronchitis.     Anxiety or stress.     Inflammation around your heart (pericarditis) or lung (pleuritis or pleurisy).     A blood clot in your lung.     A collapsed lung (pneumothorax). It can develop suddenly on its own (spontaneous pneumothorax) or from trauma to the chest.     Shingles infection (varicella-zoster virus).     Heart attack.     Damage to the bones, muscles, and cartilage that make up your chest wall. This can include:    ? Bruised bones due to injury.    ? Strained muscles or cartilage due to frequent or repeated coughing or overwork.    ? Fracture to one or more ribs.    ? Sore cartilage due to inflammation (costochondritis).    RISK FACTORS    Risk factors for chest pain may include:     Activities that increase your risk for trauma or injury to your chest.     Respiratory infections or conditions that cause frequent coughing.     Medical conditions or overeating that can cause heartburn.     Heart disease or family history of heart disease.     Conditions or health behaviors that increase your risk of developing a blood clot.     Having had chicken pox (varicella zoster).    SIGNS AND SYMPTOMS    Chest pain can feel like:     Burning or tingling on the surface of your chest or deep in your chest.     Crushing, pressure, aching, or squeezing pain.     Dull or sharp pain that is worse when you move, cough, or take a deep breath.     Pain that is also felt in your  back, neck, shoulder, or arm, or pain that spreads to any of these areas.    Your chest pain may come and go, or it may stay constant.    DIAGNOSIS    Lab tests or other studies may be needed to find the cause of your pain. Your health care provider may have you take a test called an ambulatory ECG (electrocardiogram). An ECG records your heartbeat patterns at the time the test is performed. You may also have other tests, such as:     Transthoracic echocardiogram (TTE). During echocardiography, sound waves are used to create a picture of all of the heart structures and to look at how blood flows through your heart.     Transesophageal echocardiogram (TEE).?This is a more advanced imaging test that obtains images from inside your body. It allows your health care provider to see your heart in finer detail.     Cardiac monitoring. This allows your health care provider to monitor your heart rate and rhythm in real time.     Holter monitor. This is a   portable device that records your heartbeat and can help to diagnose abnormal heartbeats. It allows your health care provider to track your heart activity for several days, if needed.     Stress tests. These can be done through exercise or by taking medicine that makes your heart beat more quickly.     Blood tests.     Imaging tests.    TREATMENT    Your treatment depends on what is causing your chest pain. Treatment may include:     Medicines. These may include:    ? Acid blockers for heartburn.    ? Anti-inflammatory medicine.    ? Pain medicine for inflammatory conditions.    ? Antibiotic medicine, if an infection is present.    ? Medicines to dissolve blood clots.    ? Medicines to treat coronary artery disease.     Supportive care for conditions that do not require medicines. This may include:    ? Resting.    ? Applying heat or cold packs to injured areas.    ? Limiting activities until pain decreases.    HOME CARE INSTRUCTIONS     If you were prescribed an antibiotic  medicine, finish it all even if you start to feel better.     Avoid any activities that bring on chest pain.     Do not use any tobacco products, including cigarettes, chewing tobacco, or electronic cigarettes. If you need help quitting, ask your health care provider.     Do not drink alcohol.     Take medicines only as directed by your health care provider.     Keep all follow-up visits as directed by your health care provider. This is important. This includes any further testing if your chest pain does not go away.     If heartburn is the cause for your chest pain, you may be told to keep your head raised (elevated) while sleeping. This reduces the chance that acid will go  from your stomach into your esophagus.     Make lifestyle changes as directed by your health care provider. These may include:    ? Getting regular exercise. Ask your health care provider to suggest some activities that are safe for you.    ? Eating a heart-healthy diet. A registered dietitian can help you to learn healthy eating options.    ? Maintaining a healthy weight.    ? Managing diabetes, if necessary.    ? Reducing stress.    SEEK MEDICAL CARE IF:     Your chest pain does not go away after treatment.     You have a rash with blisters on your chest.     You have a fever.    SEEK IMMEDIATE MEDICAL CARE IF:     Your chest pain is worse.     You have an increasing cough, or you cough up blood.     You have severe abdominal pain.     You have severe weakness.     You faint.     You have chills.     You have sudden, unexplained chest discomfort.     You have sudden, unexplained discomfort in your arms, back, neck, or jaw.     You have shortness of breath at any time.     You suddenly start to sweat, or your skin gets clammy.     You feel nauseous or you vomit.     You suddenly feel light-headed or dizzy.       Your heart begins to beat quickly, or it feels like it is skipping beats.    These symptoms may represent a serious problem that is an  emergency. Do not wait to see if the symptoms will go away. Get medical help right away. Call your local emergency services (911 in the [place] Do not drive yourself to the hospital.    This information is not intended to replace advice given to you by your health care provider. Make sure you discuss any questions you have with your health care provider.    Document Released: 04/29/2005 Document Revised: 08/10/2014 Document Reviewed: 02/23/2014  [employer] Interactive Patient Education ?2016 [employer]      Musculoskeletal     Chest Wall Pain    Chest wall pain is pain in or around the bones and muscles of your chest. Sometimes, an injury causes this pain. Sometimes, the cause may not be known. This pain may take several weeks or longer to get better.      HOME CARE    Pay attention to any changes in your symptoms. Take these actions to help with your pain:     Rest as told by your doctor.     Avoid activities that cause pain. Try not to use your chest, belly (abdominal), or side muscles to lift heavy things.     If directed, apply ice to the painful area:    ? Put ice in a plastic bag.    ? Place a towel between your skin and the bag.    ? Leave the ice on for 20 minutes, 2?3 times per day.     Take over-the-counter and prescription medicines only as told by your doctor.     Do not use tobacco products, including cigarettes, chewing tobacco, and e-cigarettes. If you need help quitting, ask your doctor.     Keep all follow-up visits as told by your doctor. This is important.    GET HELP IF:     You have a fever.     Your chest pain gets worse.     You have new symptoms.    GET HELP RIGHT AWAY IF:     You feel sick to your stomach (nauseous) or you throw up (vomit).     You feel sweaty or light-headed.     You have a cough with phlegm (sputum) or you cough up blood.     You are short of breath.    This information is not intended to replace advice given to you by your health care provider. Make sure you discuss any  questions you have with your health care provider.    Document Released: 01/06/2008 Document Revised: 04/10/2015 Document Reviewed: 10/15/2014  [employer] Interactive Patient Education ?2016 [employer]

## 2019-07-21 NOTE — ED Notes (Signed)
 ED Patient Summary       ;          [name]  [address] [place], [place] [address]  [phone]  Discharge Instructions (Patient)  _______________________________________     Name:[name] [name], [name] [name]  DOB:  Mar 14, 1951                   MRN: [id number]                   FIN: [id number]  Reason For Visit: Chest pain; CHEST PRESSURE  Final Diagnosis: Chest pain in adult     Visit Date: 07/21/2019 15:49:00  Address: [address] [place] [address]  Phone: [phone]     Emergency Department Providers:         Primary Physician:            [name] would like to thank you for allowing us  to assist you with your healthcare needs. The following includes patient education materials and information regarding your injury/illness.     Follow-up Instructions:  You were seen today on an emergency basis. Please contact your primary care doctor for a follow up appointment. If you received a referral to a specialist doctor, it is important you follow-up as instructed.    It is important that you call your follow-up doctor to schedule and confirm the location of your next appointment. Your doctor may practice at multiple locations. The office location of your follow-up appointment may be different to the one written on your discharge instructions.    If you do not have a primary care doctor, please call [phone] for help in finding a [name] Provider. For help in finding a specialist doctor, please call [phone].    The [facility] "Ask a Nurse" line in staffed by Registered Nurses and is a free service to the community. We are available Monday - Friday from 8am to 5pm to answer your questions about your health. Please call [phone].    If your condition gets worse before your follow-up with your primary care doctor or specialist, please return to the Emergency Department.        Follow Up Appointments:  Primary Care  Provider:      Name: STEPHEN CREDIT [facility]      Phone: [phone]                 With: Address: When:   [name] [address] [place], [place] [address]  [phone] Business (1) Within 1 week              Printed Prescriptions:    Patient Education Materials:  Discharge Orders          Discharge Patient 07/21/19 20:25:00 EST         Comment:      Chest Wall Pain, Easy-to-Read; Nonspecific Chest Pain     Chest Wall Pain    Chest wall pain is pain in or around the bones and muscles of your chest. Sometimes, an injury causes this pain. Sometimes, the cause may not be known. This pain may take several weeks or longer to get better.      HOME CARE    Pay attention to any changes in your symptoms. Take these actions to help with your pain:     Rest as told by your doctor.     Avoid activities that cause  pain. Try not to use your chest, belly (abdominal), or side muscles to lift heavy things.     If directed, apply ice to the painful area:    ? Put ice in a plastic bag.    ? Place a towel between your skin and the bag.    ? Leave the ice on for 20 minutes, 2?3 times per day.     Take over-the-counter and prescription medicines only as told by your doctor.     Do not use tobacco products, including cigarettes, chewing tobacco, and e-cigarettes. If you need help quitting, ask your doctor.     Keep all follow-up visits as told by your doctor. This is important.    GET HELP IF:     You have a fever.     Your chest pain gets worse.     You have new symptoms.    GET HELP RIGHT AWAY IF:     You feel sick to your stomach (nauseous) or you throw up (vomit).     You feel sweaty or light-headed.     You have a cough with phlegm (sputum) or you cough up blood.     You are short of breath.    This information is not intended to replace advice given to you by your health care provider. Make sure you discuss any questions you have with your health care provider.    Document Released: 01/06/2008 Document Revised:  04/10/2015 Document Reviewed: 10/15/2014  Elsevier Interactive Patient Education ?2016 Elsevier Inc.       Nonspecific Chest Pain    Chest pain can be caused by many different conditions. There is always a chance that your pain could be related to something serious, such as a heart attack or a blood clot in your lungs. Chest pain can also be caused by conditions that are not life-threatening. If you have chest pain, it is very important to follow up with your health care provider.      CAUSES    Chest pain can be caused by:     Heartburn.     Pneumonia or bronchitis.     Anxiety or stress.     Inflammation around your heart (pericarditis) or lung (pleuritis or pleurisy).     A blood clot in your lung.     A collapsed lung (pneumothorax). It can develop suddenly on its own (spontaneous pneumothorax) or from trauma to the chest.     Shingles infection (varicella-zoster virus).     Heart attack.     Damage to the bones, muscles, and cartilage that make up your chest wall. This can include:    ? Bruised bones due to injury.    ? Strained muscles or cartilage due to frequent or repeated coughing or overwork.    ? Fracture to one or more ribs.    ? Sore cartilage due to inflammation (costochondritis).    RISK FACTORS    Risk factors for chest pain may include:     Activities that increase your risk for trauma or injury to your chest.     Respiratory infections or conditions that cause frequent coughing.     Medical conditions or overeating that can cause heartburn.     Heart disease or family history of heart disease.     Conditions or health behaviors that increase your risk of developing a blood clot.     Having had chicken pox (varicella zoster).    SIGNS AND SYMPTOMS  Chest pain can feel like:     Burning or tingling on the surface of your chest or deep in your chest.     Crushing, pressure, aching, or squeezing pain.     Dull or sharp pain that is worse when you move, cough, or take a deep breath.     Pain that is  also felt in your back, neck, shoulder, or arm, or pain that spreads to any of these areas.    Your chest pain may come and go, or it may stay constant.    DIAGNOSIS    Lab tests or other studies may be needed to find the cause of your pain. Your health care provider may have you take a test called an ambulatory ECG (electrocardiogram). An ECG records your heartbeat patterns at the time the test is performed. You may also have other tests, such as:     Transthoracic echocardiogram (TTE). During echocardiography, sound waves are used to create a picture of all of the heart structures and to look at how blood flows through your heart.     Transesophageal echocardiogram (TEE).?This is a more advanced imaging test that obtains images from inside your body. It allows your health care provider to see your heart in finer detail.     Cardiac monitoring. This allows your health care provider to monitor your heart rate and rhythm in real time.     Holter monitor. This is a portable device that records your heartbeat and can help to diagnose abnormal heartbeats. It allows your health care provider to track your heart activity for several days, if needed.     Stress tests. These can be done through exercise or by taking medicine that makes your heart beat more quickly.     Blood tests.     Imaging tests.    TREATMENT    Your treatment depends on what is causing your chest pain. Treatment may include:     Medicines. These may include:    ? Acid blockers for heartburn.    ? Anti-inflammatory medicine.    ? Pain medicine for inflammatory conditions.    ? Antibiotic medicine, if an infection is present.    ? Medicines to dissolve blood clots.    ? Medicines to treat coronary artery disease.     Supportive care for conditions that do not require medicines. This may include:    ? Resting.    ? Applying heat or cold packs to injured areas.    ? Limiting activities until pain decreases.    HOME CARE INSTRUCTIONS     If you were  prescribed an antibiotic medicine, finish it all even if you start to feel better.     Avoid any activities that bring on chest pain.     Do not use any tobacco products, including cigarettes, chewing tobacco, or electronic cigarettes. If you need help quitting, ask your health care provider.     Do not drink alcohol.     Take medicines only as directed by your health care provider.     Keep all follow-up visits as directed by your health care provider. This is important. This includes any further testing if your chest pain does not go away.     If heartburn is the cause for your chest pain, you may be told to keep your head raised (elevated) while sleeping. This reduces the chance that acid will go  from your stomach into your esophagus.  Make lifestyle changes as directed by your health care provider. These may include:    ? Getting regular exercise. Ask your health care provider to suggest some activities that are safe for you.    ? Eating a heart-healthy diet. A registered dietitian can help you to learn healthy eating options.    ? Maintaining a healthy weight.    ? Managing diabetes, if necessary.    ? Reducing stress.    SEEK MEDICAL CARE IF:     Your chest pain does not go away after treatment.     You have a rash with blisters on your chest.     You have a fever.    SEEK IMMEDIATE MEDICAL CARE IF:     Your chest pain is worse.     You have an increasing cough, or you cough up blood.     You have severe abdominal pain.     You have severe weakness.     You faint.     You have chills.     You have sudden, unexplained chest discomfort.     You have sudden, unexplained discomfort in your arms, back, neck, or jaw.     You have shortness of breath at any time.     You suddenly start to sweat, or your skin gets clammy.     You feel nauseous or you vomit.     You suddenly feel light-headed or dizzy.     Your heart begins to beat quickly, or it feels like it is skipping beats.    These symptoms may represent a  serious problem that is an emergency. Do not wait to see if the symptoms will go away. Get medical help right away. Call your local emergency services (911 in the [place] Do not drive yourself to the hospital.    This information is not intended to replace advice given to you by your health care provider. Make sure you discuss any questions you have with your health care provider.    Document Released: 04/29/2005 Document Revised: 08/10/2014 Document Reviewed: 02/23/2014  Elsevier Interactive Patient Education ?2016 Elsevier Inc.         Allergy Info: sulfa drugs; Demerol     Medication Information:  [facility] ED Physicians provided you with a complete list of medications post discharge, if you have been instructed to stop taking a medication please ensure you also follow up with this information to your Primary Care Physician.  Unless otherwise noted, patient will continue to take medications as prescribed prior to the Emergency Room visit.  Any specific questions regarding your chronic medications and dosages should be discussed with your physician(s) and pharmacist.          albuterol (albuterol CFC free 90 mcg/inh inhalation aerosol) 1 Puffs Inhale (breathe in) once as needed for wheezing.  amoxicillin (amoxicillin 500 mg oral capsule) TAKE ONE CAPSULE BY MOUTH EVERY 8 HOURS UNTIL GONE.  ezetimibe (ezetimibe 10 mg oral tablet)  famotidine (Pepcid 20 mg oral tablet) 1 Tabs Oral (given by mouth) 2 times a day for 7 Days. Refills: 0.  FLUoxetine (FLUoxetine 20 mg oral tablet) 1 Tabs Oral (given by mouth) every day., DO NOT CRUSH     SOUND ALIKE / LOOK ALIKE - VERIFY DRUG  fluticasone-salmeterol (Advair Diskus 100 mcg-50 mcg inhalation powder) 2 times a day.  HYDROcodone-acetaminophen (Norco 325 mg-7.5 mg oral tablet) 1 Tabs Oral (given by mouth) 3 times a day.  hydrOXYzine (hydrOXYzine  hydrochloride 25 mg oral tablet)  methocarbamol (methocarbamol 750 mg oral tablet) TAKE 1 TABLET BY  MOUTH THREE TIMES DAILY.  morphine (morphine 15 mg/8 hr oral tablet, extended release) TAKE 1 TABLET BY MOUTH TWICE DAILY.  multivitamin (Vitamin B Complex oral capsule) 1 Capsules Oral (given by mouth) every day.  multivitamin, prenatal (Prenatal Multivitamins oral tablet)  oxyCODONE (Xtampza ER 13.5 mg oral capsule, extended release) 1 Capsules Oral (given by mouth) every 12 hours.  pregabalin (pregabalin 200 mg oral capsule)  rosuvastatin (rosuvastatin 40 mg oral tablet) 1 Tabs Oral (given by mouth) every day.  tiZANidine 4 Milligram Oral (given by mouth) every 8 hours.      Medications Administered During Visit:              Medication Dose Route   Sodium Chloride 0.9% 1000 mL IV          Major Tests and Procedures:  The following procedures and tests were performed during your ED visit.  PROCEDURES%>  PROCEDURES COMMENTS%>          Laboratory Orders  Name Status Details   .Trop 5th Gen Completed Blood, Stat, ST - Stat, Collected, 07/21/19 17:57:00 EST, 07/21/19 17:57:00 EST, Nurse collect, 07/21/19 17:57:00 EST, SADIE MAUDE RAMAN, [id number]   .Trop 5th Gen Completed Blood, Stat, ST - Stat, Collected, 07/21/19 19:59:00 EST, 07/21/19 19:59:00 EST, Nurse collect, 07/21/19 19:59:00 EST, Ewers, RN, Darryle HERO, [id number]   Add On Ordered Blood, Stat, ST - Stat, Collected, 07/21/19 19:14:00 EST, 07/21/19 19:14:00 EST, Darcia, RN, Darryle HERO, Print label Y/N, ddimer, Draw Stat   BMP Completed Blood, Stat, ST - Stat, 07/21/19 17:49:00 EST, 07/21/19 17:49:00 EST, Nurse collect, SADIE MAUDE S, Print label Y/N   CBCDIFF Completed Blood, Stat, ST - Stat, 07/21/19 17:49:00 EST, 07/21/19 17:49:00 EST, Nurse collect, SADIE MAUDE S, Print label Y/N   CPK Completed Blood, Stat, ST - Stat, 07/21/19 17:49:00 EST, 07/21/19 17:49:00 EST, Nurse collect, SADIE MAUDE RAMAN, Print label Y/N   D Dimer Completed Blood, Stat, ST - Stat, Collected, 07/21/19 17:57:00 EST [id number], 07/21/19 17:57:00  EST, Nurse collect, Venous Draw, 07/21/19 19:18:00 EST, BH CP Login, FORRESTER-PA-C,  PETER S, Print label Y/N, 1.8 mL Aolz/*[id number]*/   Trop T Completed Blood, Stat, ST - Stat, 07/21/19 17:49:00 EST, 07/21/19 17:49:00 EST, Nurse collect, SADIE MAUDE S, Print label Y/N   Trop T Completed Blood, Stat, ST - Stat, 07/21/19 19:41:00 EST, 07/21/19 19:41:00 EST, Nurse collect, Darcia, RN, Darryle HERO, Print label Y/N               Radiology Orders  Name Status Details   XR Chest 2 Views Completed 07/21/19 17:49:00 EST, STAT 1 hour or less, Reason: Chest pain, Transport Mode: PORTABLE, [id number]               Patient Care Orders  Name Status Details   Blood Pressure Completed 07/21/19 17:49:00 EST, Once, 07/21/19 17:49:00 EST, Check BP both arms   Cardiac/NIBP/Pulse Ox Monitoring Completed 07/21/19 17:49:00 EST, This message can only be seen by Nursing, it is not visible to Pharmacy, Laboratory, or Radiology., 07/21/19 17:49:00 EST, 07/21/19 17:49:00 EST, Once   Discharge Patient Ordered 07/21/19 20:25:00 EST   ED Assessment Adult Completed 07/21/19 16:03:36 EST, 07/21/19 16:03:36 EST   ED Only Oxygen Therapy Completed 07/21/19 17:49:00 EST, STAT 1 hour or less, 07/21/19 17:49:00 EST, Keep SAT > 92%   ED Secondary Triage Completed 07/21/19 16:03:36 EST, 07/21/19 16:03:36 EST  ED Triage Adult Completed 07/21/19 15:49:10 EST, 07/21/19 15:49:10 EST   Saline Lock Insert Completed 07/21/19 17:49:00 EST, Once, 07/21/19 17:49:00 EST       ---------------------------------------------------------------------------------------------------------------------  [facility]) encourages you to self-enroll in the [facility] Patient Portal.  [facility] Patient Portal will allow you to manage your personal health information securely from your own electronic device now and in the future.  To begin your Patient Portal enrollment process, please visit [email or web address]  Click on "Sign up now" under [facility].  If you find that you need additional assistance on the [facility] Patient Portal or need a copy of your medical records, please call the [facility] Medical Records Office at [phone].  Comment:

## 2019-07-21 NOTE — ED Provider Notes (Signed)
Chest Pain *ED        Patient:   [name] [name], [name] [name]             MRN: [id number]            FIN: [id number]               Age:   68 years     Sex:  Female     DOB:  1951-03-16   Associated Diagnoses:   Chest pain in adult   Author:   Jerilynn Som      Basic Information   Time seen: Provider Seen (ST)   ED Provider/Time:    Jerilynn Som / 07/21/2019 17:37  , Date & time 07/21/2019 18:04:00.   History source: Patient.   Arrival mode: Private vehicle.   History limitation: None.   Additional information: Chief Complaint from Nursing Triage Note   Chief Complaint  Chief Complaint: left ant chestpain when leans over, hurts with palpation started 4 days ago,has to hold breast when bends over  no fevers, pt is smoker (07/21/19 15:56:00).      History of Present Illness   The patient presents with chest pain.  The onset was 4  days ago.  The course/duration of symptoms is constant and fluctuating in intensity.  Location: Generalized left anterior chest. Radiating pain: left shoulder. The character of symptoms is sharp.  The degree at onset was minimal.  The degree at maximum was minimal.  The degree at present is minimal.  The exacerbating factor is leaning over.  The relieving factor is rest but not leaning forward.  Risk factors consist of hyperlipidemia and hx of pancreatitis.  Prior episodes: non-cardiac.  Therapy today None.  Associated symptoms: none.  Additional history: Patient presents for evaluation for left sided chest pain that started approx 4 days ago. Patient states that her pain is constant but increases when she leans forward. Patient denies acute SOB, n/v, fever, or cough. Patient denies known hx of cardiac abnormalities.        Review of Systems   Constitutional symptoms:  Negative except as documented in HPI.   Cardiovascular symptoms:  Chest pain.             Additional review of systems information: All other systems reviewed and otherwise negative, All systems reviewed as documented in  chart.      Health Status   Allergies:    Allergic Reactions (Selected)  Unknown  Demerol- Makes me stop breathing.  Sulfa drugs- [place]knwon and burn and itch..   Medications:  (Selected)   Inpatient Medications  Ordered  Sodium Chloride 0.9% bolus: 1,000 mL, 1000 mL/hr, IV, Once  Prescriptions  Prescribed  Pepcid 20 mg oral tablet: 20 mg, 1 tabs, Oral, BID, for 7 days, 14 tabs, 0 Refill(s)  Documented Medications  Documented  Advair Diskus 100 mcg-50 mcg inhalation powder: inh, BID, 0 Refill(s)  FLUoxetine 20 mg oral tablet: 20 mg, 1 tabs, Oral, Daily, 30 tabs, 0 Refill(s)  Norco 325 mg-7.5 mg oral tablet: 1 tabs, Oral, TID, 0 Refill(s)  Prenatal Multivitamins oral tablet: 0 Refill(s)  Vitamin B Complex oral capsule: 1 caps, Oral, Daily, 30 caps, 0 Refill(s)  Xtampza ER 13.5 mg oral capsule, extended release: 13.5 mg, 1 caps, Oral, q12hr, 0 Refill(s)  albuterol CFC free 90 mcg/inh inhalation aerosol: 1 puffs, Inhale, Once, PRN: for wheezing, 18 g, 0 Refill(s)  amoxicillin 500 mg oral capsule: TAKE ONE CAPSULE BY MOUTH EVERY 8  HOURS UNTIL GONE  ezetimibe 10 mg oral tablet: 0 Refill(s)  hydrOXYzine hydrochloride 25 mg oral tablet: 0 Refill(s)  methocarbamol 750 mg oral tablet: TAKE 1 TABLET BY MOUTH THREE TIMES DAILY  morphine 15 mg/8 hr oral tablet, extended release: TAKE 1 TABLET BY MOUTH TWICE DAILY  pregabalin 200 mg oral capsule: 0 Refill(s)  rosuvastatin 40 mg oral tablet: 40 mg, 1 tabs, Oral, Daily, 0 Refill(s)  tiZANidine: 4 mg, Oral, q8hr, 0 Refill(s).      Past Medical/ Family/ Social History   Problem list:    Active Problems (5)  Back pain   CP (chronic pancreatitis)   Hypercholesteremia   Neuropathy   Sciatic pain   , per nurse's notes.      Physical Examination               Vital Signs   Vital Signs   07/21/2019 18:04 EST Respiratory Rate 16 br/min   02/54/2706 23:76 EST Systolic Blood Pressure 283 mmHg    Diastolic Blood Pressure 77 mmHg    Peripheral Pulse Rate 73 bpm    Heart Rate Monitored 73 bpm     Mean Arterial Pressure, Cuff 105 mmHg  HI    SpO2 98 %   15/17/6160 73:71 EST Systolic Blood Pressure 062 mmHg    Diastolic Blood Pressure 70 mmHg    Temperature Oral 36.7 degC    Heart Rate Monitored 85 bpm    Respiratory Rate 17 br/min    SpO2 100 %   .   Measurements   07/21/2019 16:03 EST Body Mass Index est meas 35.53 kg/m2    Body Mass Index Measured 35.53 kg/m2   07/21/2019 15:56 EST Height/Length Measured 166 cm    Weight Dosing 97.9 kg   .   Basic Oxygen Information   07/21/2019 18:02 EST Oxygen Therapy Room air    SpO2 98 %   07/21/2019 15:56 EST Oxygen Therapy Room air    SpO2 100 %   .   General:  Alert, no acute distress.    Skin:  Warm, dry.    Head:  Normocephalic.   Neck:  Supple, no tenderness.    Eye:  Pupils are equal, round and reactive to light, extraocular movements are intact.    Ears, nose, mouth and throat:  Oral mucosa moist.   Cardiovascular:  Regular rate and rhythm.   Respiratory:  Lungs are clear to auscultation, respirations are non-labored.       Medical Decision Making   Differential Diagnosis:  Atypical chest pain, pneumonia, pleurisy, chest wall pain.    Rationale:  PA/NP reviewed with co-signing physician: any ECG, lab results, radiology studies, medication, diagnosis, and plan of care.   Documents reviewed:  Emergency department nurses' notes.   Results review:  Lab results : Lab View   07/21/2019 19:59 EST Trop T Quant <0.010 ng/mL   07/21/2019 18:25 EST Estimated Creatinine Clearance 70.71 mL/min   07/21/2019 17:57 EST WBC 7.2 x10e3/mcL    RBC 4.21 x10e6/mcL    Hgb 13.1 g/dL    HCT 38.9 %    MCV 92.4 fL    MCH 31.1 pg    MCHC 33.7 g/dL    RDW 12.9 %    Platelet 270 x10e3/mcL    MPV 9.0 fL    Neutro Auto 58.5 %    Neutro Absolute 4.2 x10e3/mcL    Immature Grans Percent 0.0 %  LOW    Immature Grans Absolute 0.00 x10e3/mcL    Lymph  Auto 25.7 %    Lymph Absolute 1.8 x10e3/mcL    Mono Auto 6.7 %    Mono Absolute 0.5 x10e3/mcL    Eosinophil Percent 8.4 %  HI    Eos Absolute 0.6  x10e3/mcL  HI    Basophil Auto 0.7 %    Baso Absolute 0.1 x10e3/mcL    D Dimer 0.46 mcg/mL FEU    Sodium Lvl 137 mmol/L    Potassium Lvl 4.0 mmol/L    Chloride 99 mmol/L    CO2 26 mmol/L    Glucose Random 129 mg/dL  HI    BUN 8 mg/dL    Creatinine Lvl 0.9 mg/dL    AGAP 12 mmol/L    Osmolality Calc 274 mOsm/kg    Calcium Lvl 9.4 mg/dL    eGFR AA 77 mL/min/1.50m???  LOW    eGFR Non-AA 66 mL/min/1.740m??  LOW    Trop T Quant <0.010 ng/mL    Trop 5th Gen Study <6.0 ng/L    Total CK 145 unit/L   07/21/2019 16:03 EST Estimated Creatinine Clearance 63.64 mL/min   .   Notes:  I reviewed patient's past medical record, surgical records, and ED nurses note. .   Radiology results:  Rad Results (ST)   XR Chest 2 Views  ?  07/21/19 18:16:55  Chest PA and lateral: 07/21/19    INDICATION:Chest pain.    COMPARISON: 03/27/2019    FINDINGS:    Lungs: Lungs are clear.    Pleura: No pleural effusions. No pneumothorax.    Cardiomediastinum: Unremarkable.    Bones: No acute osseous abnormality.    IMPRESSION:  No radiographic evidence of acute cardiopulmonary process.  ?  Signed By: NGRebeca Allegra    Reexamination/ Reevaluation   Vital signs   Basic Oxygen Information   07/21/2019 18:02 EST Oxygen Therapy Room air    SpO2 98 %   07/21/2019 15:56 EST Oxygen Therapy Room air    SpO2 100 %      Course: improving.   Pain status: decreased.   Assessment: exam improved.      Impression and Plan   Diagnosis   Chest pain in adult (ICD10-CM R07.9, Discharge, Medical)   Plan   Condition: Improved, Stable.    Disposition: Discharged: Time  07/21/2019 20:21:00, to home.    Patient was given the following educational materials: Nonspecific Chest Pain, Chest Wall Pain, Easy-to-Read, Chest Wall Pain, Easy-to-Read, Nonspecific Chest Pain.    Follow up with: [name] Within 1 week.    Counseled: Patient, Regarding diagnosis, Regarding diagnostic results, Regarding treatment plan, Regarding prescription, Patient indicated understanding of  instructions.    Notes: I had a lengthy discussion with the patient regarding all test results. Patient is to follow-up with her primary care physician for reevaluation of current symptoms and continued management. Patient is to follow-up with the ER symptoms worsen or new symptoms develop., patient was also advised to follow-up with cardiology for reevaluation of current symptoms.  Patient may follow-up in the ER symptoms worsen or new symptom develop.. Designer, industrial/productigned on 07/21/2019 08:24 PM EST   ________________________________________________   FOJerilynn Som    Electronically Signed on 07/23/2019 04:19 PM EST   ________________________________________________   BALonie Peak          Modified by: FOJerilynn Somn 07/21/2019 08:22 PM EST      Modified by:  Pricilla Loveless S on 07/21/2019 08:24 PM EST

## 2019-07-22 LAB — D-DIMER, QUANTITATIVE: D-Dimer, Quant: 0.46 mcg/mL FEU (ref 0.19–0.51)

## 2019-07-22 LAB — TROPONIN T: Troponin T: <0.01 ng/mL (ref 0.000–0.010)

## 2019-07-22 LAB — TROPONIN: TROP 5TH GEN STUDY: <6 ng/L (ref 0.0–12.0)

## 2019-07-31 LAB — ADD ON LAB TEST

## 2022-10-27 IMAGING — OT DXA BONE DENSITY
2 series · 2 of 2 positions shown · non-contrast
Comparison: none

REASON FOR EXAM: Postmenopausal screening.

RISK FACTORS:  Personal history of fragility fracture not involving spine or hip.  Current history of smoking.
PRIOR EXAMS:  DXA from [HOSPITAL] dated 10/29/2015.
METHOD:  Scans of the lumbar spine and left hip were performed using dual energy X-ray densitometry (DXA)

[Series 1: — · 1 of 1 slices shown (1 of 2)]
[im 1/1]
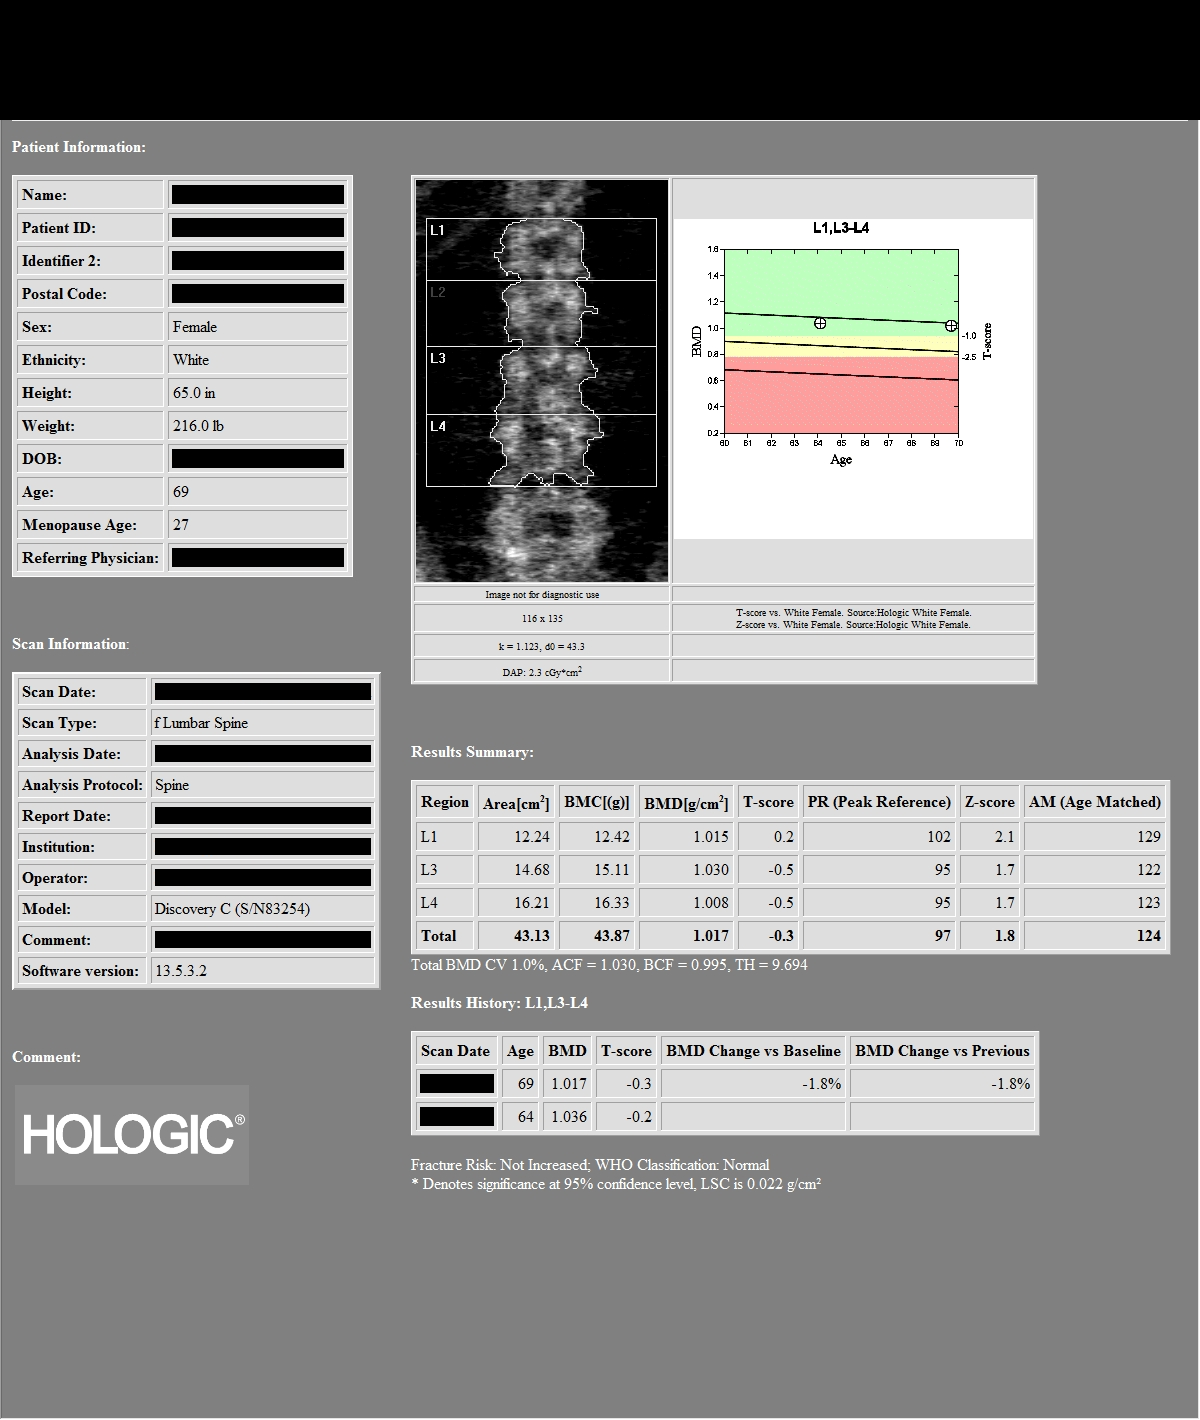

[Series 2: — · left · 1 of 1 slices shown (2 of 2)]
[im 1/1]
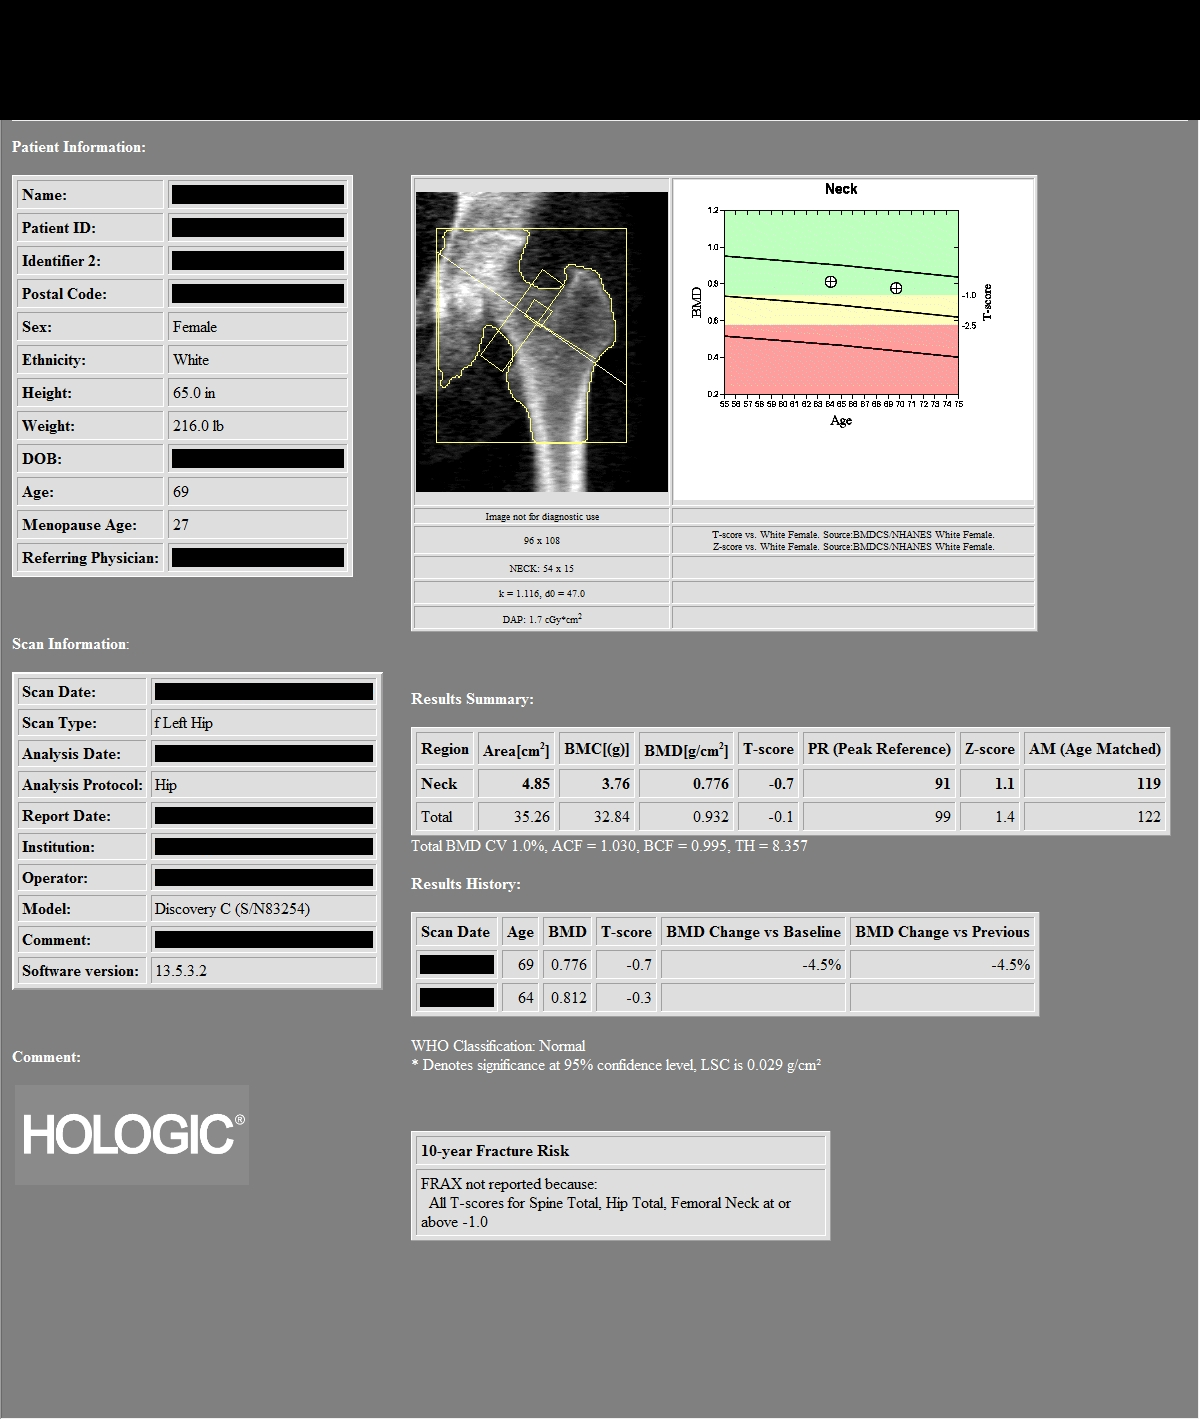

[2 of 2 positions shown; findings below may reference images not displayed]

IMPRESSION: As defined by World Health Organization, the patient meets the criteria for NORMAL BONE DENSITY based on lumbar spine and left hip T-scores.

PATIENT DEMOGRAPHICS:  70-year-old white female.
FINDINGS: 1.    Review of scanogram images shows no factor invalidating scan results.  

2.    The lumbar spine exam using L1,L3-L4 regions shows average Bone Mineral Density is 1.017 gm/cm2 of Hydroxyapatite.  The T-score (comparing patient with a young adult group) is 0.3 standard deviations BELOW mean. The Z-score (comparing patient with an age-matched group) is 1.8 standard deviations ABOVE mean.

COMPARED TO PRIOR DXA, spine bone density was 1.036 gm/cm2.  This is an interval decrease of 0.019 gm/cm2 or -1.8%. Technologist's least significant change in the spine is 0.030 gm/cm2. This is not a statistically significant interval decrease.

3.  The left hip exam using femoral neck region of interest shows average Bone Mineral Density is 0.776 gm/cm2 of Hydroxyapatite. The T-score (comparing patient with a young adult group) is 0.7 standard deviations BELOW mean. The Z-score (comparing patient with an age-matched group) is 1.1 standard deviations ABOVE mean.

COMPARED TO PRIOR DXA, femoral neck bone density was 0.812 gm/cm2.  This is an interval decrease of 0.036 gm/cm2 or -4.5%. Technologist's least significant change in the hip is 0.032 gm/cm2. This is a statistically significant interval decrease.

RECOMMENDATIONS: The patient states that she is not supplements on a regular basis and regular exercise to patient tolerance would be of benefit.  The patient is currently not taking prescribed medication for prevention of bone loss.  According to criteria established by the National [employer]opor[employer], the patient DOES NOT meet the current indications for prescribed medical therapy.  The National [employer]opor[employer] Foundation now recommends followup DXA scanning every two years in patients at risk regardless of whether the patient is undergoing pharmacological treatment.
